# Patient Record
Sex: Female | Born: 1955 | Race: White | Hispanic: No | Smoking: Former smoker
Health system: Southern US, Community
[De-identification: ages and names within clinical notes are randomized; demographics above are authoritative.]

## PROBLEM LIST (undated history)

## (undated) DIAGNOSIS — J449 Chronic obstructive pulmonary disease, unspecified: Secondary | ICD-10-CM

## (undated) DIAGNOSIS — E079 Disorder of thyroid, unspecified: Secondary | ICD-10-CM

## (undated) DIAGNOSIS — E7800 Pure hypercholesterolemia, unspecified: Secondary | ICD-10-CM

## (undated) DIAGNOSIS — I10 Essential (primary) hypertension: Secondary | ICD-10-CM

## (undated) DIAGNOSIS — I219 Acute myocardial infarction, unspecified: Secondary | ICD-10-CM

## (undated) DIAGNOSIS — K219 Gastro-esophageal reflux disease without esophagitis: Secondary | ICD-10-CM

## (undated) DIAGNOSIS — J439 Emphysema, unspecified: Secondary | ICD-10-CM

## (undated) DIAGNOSIS — K529 Noninfective gastroenteritis and colitis, unspecified: Secondary | ICD-10-CM

## (undated) HISTORY — PX: CHOLECYSTECTOMY: SHX55

---

## 2013-01-21 ENCOUNTER — Emergency Department (HOSPITAL_COMMUNITY): Payer: MEDICARE

## 2013-01-21 ENCOUNTER — Encounter (HOSPITAL_COMMUNITY): Payer: Self-pay | Admitting: Emergency Medicine

## 2013-01-21 ENCOUNTER — Observation Stay (HOSPITAL_COMMUNITY)
Admission: EM | Admit: 2013-01-21 | Discharge: 2013-01-23 | Disposition: A | Discharge status: Home / Self Care | Payer: MEDICARE | Attending: Internal Medicine | Admitting: Internal Medicine

## 2013-01-21 DIAGNOSIS — I10 Essential (primary) hypertension: Secondary | ICD-10-CM | POA: Insufficient documentation

## 2013-01-21 DIAGNOSIS — Z6841 Body Mass Index (BMI) 40.0 and over, adult: Secondary | ICD-10-CM | POA: Insufficient documentation

## 2013-01-21 DIAGNOSIS — Z8249 Family history of ischemic heart disease and other diseases of the circulatory system: Secondary | ICD-10-CM | POA: Insufficient documentation

## 2013-01-21 DIAGNOSIS — J449 Chronic obstructive pulmonary disease, unspecified: Secondary | ICD-10-CM | POA: Diagnosis present

## 2013-01-21 DIAGNOSIS — J9610 Chronic respiratory failure, unspecified whether with hypoxia or hypercapnia: Secondary | ICD-10-CM | POA: Insufficient documentation

## 2013-01-21 DIAGNOSIS — E7800 Pure hypercholesterolemia, unspecified: Secondary | ICD-10-CM | POA: Insufficient documentation

## 2013-01-21 DIAGNOSIS — K219 Gastro-esophageal reflux disease without esophagitis: Secondary | ICD-10-CM | POA: Insufficient documentation

## 2013-01-21 DIAGNOSIS — E785 Hyperlipidemia, unspecified: Secondary | ICD-10-CM

## 2013-01-21 DIAGNOSIS — E079 Disorder of thyroid, unspecified: Secondary | ICD-10-CM | POA: Insufficient documentation

## 2013-01-21 DIAGNOSIS — Z7982 Long term (current) use of aspirin: Secondary | ICD-10-CM | POA: Insufficient documentation

## 2013-01-21 DIAGNOSIS — Z87891 Personal history of nicotine dependence: Secondary | ICD-10-CM | POA: Insufficient documentation

## 2013-01-21 DIAGNOSIS — Z9981 Dependence on supplemental oxygen: Secondary | ICD-10-CM | POA: Insufficient documentation

## 2013-01-21 DIAGNOSIS — I252 Old myocardial infarction: Secondary | ICD-10-CM | POA: Insufficient documentation

## 2013-01-21 DIAGNOSIS — J438 Other emphysema: Secondary | ICD-10-CM | POA: Insufficient documentation

## 2013-01-21 DIAGNOSIS — R0789 Other chest pain: Principal | ICD-10-CM | POA: Insufficient documentation

## 2013-01-21 DIAGNOSIS — R079 Chest pain, unspecified: Secondary | ICD-10-CM | POA: Diagnosis present

## 2013-01-21 HISTORY — DX: Acute myocardial infarction, unspecified: I21.9

## 2013-01-21 HISTORY — DX: Gastro-esophageal reflux disease without esophagitis: K21.9

## 2013-01-21 HISTORY — DX: Disorder of thyroid, unspecified: E07.9

## 2013-01-21 HISTORY — DX: Chronic obstructive pulmonary disease, unspecified: J44.9

## 2013-01-21 HISTORY — DX: Noninfective gastroenteritis and colitis, unspecified: K52.9

## 2013-01-21 HISTORY — DX: Pure hypercholesterolemia, unspecified: E78.00

## 2013-01-21 HISTORY — DX: Essential (primary) hypertension: I10

## 2013-01-21 HISTORY — DX: Emphysema, unspecified: J43.9

## 2013-01-21 LAB — COMPREHENSIVE METABOLIC PANEL
AST: 29 U/L (ref 0–37)
BUN: 14 mg/dL (ref 6–23)
CO2: 33 mEq/L — ABNORMAL HIGH (ref 19–32)
Calcium: 9.8 mg/dL (ref 8.4–10.5)
Chloride: 93 mEq/L — ABNORMAL LOW (ref 96–112)
Creatinine, Ser: 1.26 mg/dL — ABNORMAL HIGH (ref 0.50–1.10)
GFR calc non Af Amer: 47 mL/min — ABNORMAL LOW (ref >90)
Glucose, Bld: 122 mg/dL — ABNORMAL HIGH (ref 70–99)
Total Bilirubin: 0.3 mg/dL (ref 0.3–1.2)

## 2013-01-21 LAB — CBC WITH DIFFERENTIAL/PLATELET
HCT: 39.9 % (ref 36.0–46.0)
Hemoglobin: 12.8 g/dL (ref 12.0–15.0)
Lymphocytes Relative: 27 % (ref 12–46)
Lymphs Abs: 2.3 10*3/uL (ref 0.7–4.0)
Monocytes Absolute: 0.6 10*3/uL (ref 0.1–1.0)
Monocytes Relative: 7 % (ref 3–12)
Neutro Abs: 5.3 10*3/uL (ref 1.7–7.7)
RBC: 4.34 MIL/uL (ref 3.87–5.11)
WBC: 8.5 10*3/uL (ref 4.0–10.5)

## 2013-01-21 LAB — TROPONIN I: Troponin I: <0.3 ng/mL (ref <0.30)

## 2013-01-21 LAB — PRO B NATRIURETIC PEPTIDE: Pro B Natriuretic peptide (BNP): 81.4 pg/mL (ref 0–125)

## 2013-01-21 MED ORDER — IOHEXOL 350 MG/ML SOLN
100.0000 mL | Freq: Once | INTRAVENOUS | Status: AC | PRN
  Administered 2013-01-21: 100 mL via INTRAVENOUS

## 2013-01-21 MED ORDER — SODIUM CHLORIDE 0.9 % IV BOLUS (SEPSIS)
1000.0000 mL | Freq: Once | INTRAVENOUS | Status: AC
  Administered 2013-01-22: 1000 mL via INTRAVENOUS

## 2013-01-21 MED ORDER — SODIUM CHLORIDE 0.9 % IV BOLUS (SEPSIS)
1000.0000 mL | Freq: Once | INTRAVENOUS | Status: AC
  Administered 2013-01-21: 1000 mL via INTRAVENOUS

## 2013-01-21 NOTE — ED Notes (Signed)
Pt BIB EMS. Pt is here visiting family, originally from [place]. Pt states that she was watching TV when she started having left sided chest pain. Pt reports that she has a family history of sudden, fatal MIs, so she always come to the hospital if she starts having chest pain. Pt also has personal hx of MI. Pt states she took 2 NTG with no relief, and the medic reported that the pt was given 2 NTG and 324 mg of Aspirin en route with no relief.

## 2013-01-21 NOTE — ED Notes (Signed)
Pt states that she had a stress test a few months ago, and reports that her heart is "ok".

## 2013-01-21 NOTE — ED Provider Notes (Signed)
CSN: [id number]     Arrival date & time 01/21/13  1951 History   First MD Initiated Contact with Patient 01/21/13 2109     Chief Complaint  Patient presents with  . Chest Pain  . Shortness of Breath   (Consider location/radiation/quality/duration/timing/severity/associated sxs/prior Treatment) Patient is a 57 y.o. female presenting with chest pain.  Chest Pain Pain location:  L chest and substernal area Pain quality: pressure   Pain radiates to:  Does not radiate Pain severity:  Moderate Onset quality:  Sudden Timing:  Constant Progression:  Unchanged Chronicity:  New Context: at rest   Relieved by:  Nothing Ineffective treatments:  Nitroglycerin Associated symptoms: diaphoresis, dizziness, nausea and shortness of breath   Associated symptoms: no abdominal pain, no cough, no fever and not vomiting     Past Medical History  Diagnosis Date  . COPD (chronic obstructive pulmonary disease)   . Hypertension   . Emphysema   . GERD (gastroesophageal reflux disease)   . High cholesterol   . Thyroid disease   . MI (myocardial infarction)   . Colitis    Past Surgical History  Procedure Laterality Date  . Cholecystectomy    . Cesarean section     History reviewed. No pertinent family history. History  Substance Use Topics  . Smoking status: Former Games developer  . Smokeless tobacco: Not on file  . Alcohol Use: No   OB History   Grav Para Term Preterm Abortions TAB SAB Ect Mult Living                 Review of Systems  Constitutional: Positive for diaphoresis. Negative for fever.  HENT: Negative for congestion.   Respiratory: Positive for shortness of breath. Negative for cough.   Cardiovascular: Positive for chest pain.  Gastrointestinal: Positive for nausea. Negative for vomiting, abdominal pain and diarrhea.  Neurological: Positive for dizziness.  All other systems reviewed and are negative.    Allergies  Ativan; Prednisone; Atorvastatin; and Vicodin  Home  Medications   Current Outpatient Rx  Name  Route  Sig  Dispense  Refill  . albuterol (PROVENTIL HFA;VENTOLIN HFA) 108 (90 BASE) MCG/ACT inhaler   Inhalation   Inhale 1-2 puffs into the lungs every 6 (six) hours as needed for wheezing or shortness of breath.         . clonazePAM (KLONOPIN) 2 MG tablet   Oral   Take 2 mg by mouth 2 (two) times daily.         . diphenhydrAMINE (BENADRYL) 25 MG tablet   Oral   Take 25 mg by mouth at bedtime as needed for sleep.         . DULoxetine (CYMBALTA) 30 MG capsule   Oral   Take 30 mg by mouth every morning.         . DULoxetine (CYMBALTA) 60 MG capsule   Oral   Take 60 mg by mouth daily.          Marland Kitchen esomeprazole (NEXIUM) 40 MG capsule   Oral   Take 40 mg by mouth daily at 12 noon.         . Fluticasone-Salmeterol (ADVAIR) 250-50 MCG/DOSE AEPB   Inhalation   Inhale 1 puff into the lungs 2 (two) times daily.         Marland Kitchen levothyroxine (SYNTHROID, LEVOTHROID) 125 MCG tablet   Oral   Take 250 mcg by mouth daily before breakfast. Takes 2 tablet every morning         .  lisinopril-hydrochlorothiazide (PRINZIDE,ZESTORETIC) 20-12.5 MG per tablet   Oral   Take 1 tablet by mouth daily.         . Multiple Vitamin (MULTIVITAMIN WITH MINERALS) TABS tablet   Oral   Take 1 tablet by mouth daily.         Marland Kitchen oxyCODONE (ROXICODONE) 15 MG immediate release tablet   Oral   Take 15 mg by mouth every 12 (twelve) hours.         Marland Kitchen tiotropium (SPIRIVA) 18 MCG inhalation capsule   Inhalation   Place 18 mcg into inhaler and inhale daily.         Marland Kitchen topiramate (TOPAMAX) 100 MG tablet   Oral   Take 100 mg by mouth 2 (two) times daily.          BP 82/68  Pulse 88  Temp(Src) 97.4 F (36.3 C) (Oral)  Resp 22  Ht 5\' 9"  (1.753 m)  Wt 334 lb (151.501 kg)  BMI 49.30 kg/m2  SpO2 95% Physical Exam  Nursing note and vitals reviewed. Constitutional: She is oriented to person, place, and time. She appears well-developed and  well-nourished. No distress.  obese  HENT:  Head: Normocephalic and atraumatic.  Mouth/Throat: Oropharynx is clear and moist.  Eyes: Conjunctivae are normal. Pupils are equal, round, and reactive to light. No scleral icterus.  Neck: Neck supple.  Cardiovascular: Normal rate, regular rhythm, normal heart sounds and intact distal pulses.   No murmur heard. Pulmonary/Chest: Effort normal and breath sounds normal. No stridor. No respiratory distress. She has no wheezes. She has no rales. She exhibits tenderness.  Abdominal: Soft. Bowel sounds are normal. She exhibits no distension. There is no tenderness. There is no rebound and no guarding.  Musculoskeletal: Normal range of motion. She exhibits edema (1+ nonpitting edema in LLE).  Neurological: She is alert and oriented to person, place, and time.  Skin: Skin is warm and dry. No rash noted.  Psychiatric: She has a normal mood and affect. Her behavior is normal.    ED Course  Procedures (including critical care time) Labs Review Labs Reviewed  COMPREHENSIVE METABOLIC PANEL - Abnormal; Notable for the following:    Chloride 93 (*)    CO2 33 (*)    Glucose, Bld 122 (*)    Creatinine, Ser 1.26 (*)    Alkaline Phosphatase 135 (*)    GFR calc non Af Amer 47 (*)    GFR calc Af Amer 54 (*)    All other components within normal limits  CBC WITH DIFFERENTIAL  TROPONIN I  PRO B NATRIURETIC PEPTIDE   Imaging Review Ct Angio Chest Pe W/cm &/or Wo Cm  01/22/2013   CLINICAL DATA:  Chest pain  EXAM: CT ANGIOGRAPHY CHEST WITH CONTRAST  TECHNIQUE: Multidetector CT imaging of the chest was performed using the standard protocol during bolus administration of intravenous contrast. Multiplanar CT image reconstructions including MIPs were obtained to evaluate the vascular anatomy.  CONTRAST:  OMNIPAQUE IOHEXOL 350 MG/ML SOLN  COMPARISON:  none  FINDINGS: Previous filling defects within the pulmonary arteries to suggest acute pulmonary embolism. No  acute findings aorta great vessels. No pericardial fluid.  No axillary or supraclavicular lymphadenopathy. The thyroid gland is enlarged. The left lobe extends beneath the manubrium.  Review of the lung parenchyma demonstrates no effusion, infiltrate, pneumothorax. Review of the upper abdomen is unremarkable. Limited view of the skeleton is unremarkable.  Review of the MIP images confirms the above findings.  IMPRESSION: No evidence of  acute pulmonary embolism.  Thyroid goiter   Electronically Signed   By: Genevive Bi M.D.   On: 01/22/2013 00:01   Dg Chest Port 1 View  01/21/2013   CLINICAL DATA:  Chest pain.  EXAM: PORTABLE CHEST - 1 VIEW  COMPARISON:  None.  FINDINGS: The lungs are well-aerated. Minimal right basilar airspace opacity likely reflects atelectasis. There is no evidence of pleural effusion or pneumothorax.  The cardiomediastinal silhouette is within normal limits. No acute osseous abnormalities are seen.  IMPRESSION: Minimal right basilar airspace opacity likely reflects atelectasis; lungs otherwise clear.   Electronically Signed   By: Roanna Raider M.D.   On: 01/21/2013 22:51  All radiology studies independently viewed by me.     EKG Interpretation     Ventricular Rate:  86 PR Interval:  174 QRS Duration: 97 QT Interval:  411 QTC Calculation: 492 R Axis:   -75 Text Interpretation:  Age not entered, assumed to be  57 years old for purpose of ECG interpretation Sinus rhythm Left anterior fascicular block Low voltage, precordial leads Abnormal R-wave progression, late transition Borderline prolonged QT interval No old tracing to compare            MDM   1. Chest pain    57 yo female with hx of CAD and COPD presenting with pressure like chest pain.  Given 324mg  aspirin by EMS PTA.  On arrival, BPs soft, but no distress and well appearing.  No fevers or cough.  Recent car ride and unilateral leg swelling, will obtain CTA PE study as well.    CTA PE study negative.   Initial troponin negative.  Consulted internal medicine for admission and further workup.  Fluids improved BP somewhat.    Candyce Churn, MD 01/22/13 1106

## 2013-01-21 NOTE — ED Notes (Signed)
Wofford, MD at bedside 

## 2013-01-22 DIAGNOSIS — I10 Essential (primary) hypertension: Secondary | ICD-10-CM

## 2013-01-22 DIAGNOSIS — R079 Chest pain, unspecified: Secondary | ICD-10-CM | POA: Diagnosis present

## 2013-01-22 LAB — TROPONIN I
Troponin I: <0.3 ng/mL (ref <0.30)
Troponin I: <0.3 ng/mL (ref <0.30)

## 2013-01-22 LAB — POCT I-STAT TROPONIN I: Troponin i, poc: 0 ng/mL (ref 0.00–0.08)

## 2013-01-22 MED ORDER — ASPIRIN 325 MG PO TABS
325.0000 mg | ORAL_TABLET | Freq: Every day | ORAL | Status: DC
  Administered 2013-01-22: 325 mg via ORAL
  Filled 2013-01-22 (×2): qty 1

## 2013-01-22 MED ORDER — SODIUM CHLORIDE 0.9 % IV SOLN: 250.0000 mL | INTRAVENOUS | Status: DC | PRN

## 2013-01-22 MED ORDER — ALBUTEROL SULFATE HFA 108 (90 BASE) MCG/ACT IN AERS: 1.0000 | INHALATION_SPRAY | Freq: Four times a day (QID) | RESPIRATORY_TRACT | Status: DC | PRN

## 2013-01-22 MED ORDER — SODIUM CHLORIDE 0.9 % IJ SOLN
3.0000 mL | Freq: Two times a day (BID) | INTRAMUSCULAR | Status: DC
  Administered 2013-01-22: 3 mL via INTRAVENOUS

## 2013-01-22 MED ORDER — SODIUM CHLORIDE 0.9 % IJ SOLN: 3.0000 mL | INTRAMUSCULAR | Status: DC | PRN

## 2013-01-22 MED ORDER — LEVOTHYROXINE SODIUM 125 MCG PO TABS
250.0000 ug | ORAL_TABLET | Freq: Every day | ORAL | Status: DC
  Administered 2013-01-22 – 2013-01-23 (×2): 250 ug via ORAL
  Filled 2013-01-22 (×3): qty 2

## 2013-01-22 MED ORDER — DULOXETINE HCL 60 MG PO CPEP: 60.0000 mg | ORAL_CAPSULE | Freq: Every day | ORAL | Status: DC

## 2013-01-22 MED ORDER — MOMETASONE FURO-FORMOTEROL FUM 100-5 MCG/ACT IN AERO
2.0000 | INHALATION_SPRAY | Freq: Two times a day (BID) | RESPIRATORY_TRACT | Status: DC
  Administered 2013-01-22 – 2013-01-23 (×3): 2 via RESPIRATORY_TRACT
  Filled 2013-01-22: qty 8.8

## 2013-01-22 MED ORDER — OXYCODONE HCL 5 MG PO TABS
15.0000 mg | ORAL_TABLET | Freq: Two times a day (BID) | ORAL | Status: DC
  Administered 2013-01-22 – 2013-01-23 (×4): 15 mg via ORAL
  Filled 2013-01-22 (×4): qty 3

## 2013-01-22 MED ORDER — ASPIRIN 81 MG PO CHEW
81.0000 mg | CHEWABLE_TABLET | ORAL | Status: AC
Start: 2013-01-23 — End: 2013-01-23
  Administered 2013-01-23: 81 mg via ORAL
  Filled 2013-01-22: qty 1

## 2013-01-22 MED ORDER — LISINOPRIL-HYDROCHLOROTHIAZIDE 20-12.5 MG PO TABS: 1.0000 | ORAL_TABLET | Freq: Every day | ORAL | Status: DC

## 2013-01-22 MED ORDER — NITROGLYCERIN 2 % TD OINT
0.5000 [in_us] | TOPICAL_OINTMENT | Freq: Four times a day (QID) | TRANSDERMAL | Status: DC
  Administered 2013-01-22 – 2013-01-23 (×2): 0.5 [in_us] via TOPICAL
  Filled 2013-01-22: qty 30

## 2013-01-22 MED ORDER — SODIUM CHLORIDE 0.9 % IV SOLN
INTRAVENOUS | Status: DC
  Administered 2013-01-23: 05:00:00 via INTRAVENOUS

## 2013-01-22 MED ORDER — TOPIRAMATE 100 MG PO TABS
100.0000 mg | ORAL_TABLET | Freq: Two times a day (BID) | ORAL | Status: DC
  Administered 2013-01-22 (×3): 100 mg via ORAL
  Filled 2013-01-22 (×5): qty 1

## 2013-01-22 MED ORDER — PANTOPRAZOLE SODIUM 40 MG PO TBEC
80.0000 mg | DELAYED_RELEASE_TABLET | Freq: Every day | ORAL | Status: DC
  Administered 2013-01-22 – 2013-01-23 (×2): 80 mg via ORAL
  Filled 2013-01-22 (×2): qty 2

## 2013-01-22 MED ORDER — HEPARIN SODIUM (PORCINE) 5000 UNIT/ML IJ SOLN
5000.0000 [IU] | Freq: Three times a day (TID) | INTRAMUSCULAR | Status: DC
  Administered 2013-01-22 – 2013-01-23 (×3): 5000 [IU] via SUBCUTANEOUS
  Filled 2013-01-22 (×8): qty 1

## 2013-01-22 MED ORDER — DIPHENHYDRAMINE HCL 25 MG PO TABS
25.0000 mg | ORAL_TABLET | Freq: Every evening | ORAL | Status: DC | PRN
  Filled 2013-01-22: qty 1

## 2013-01-22 MED ORDER — LISINOPRIL 20 MG PO TABS
20.0000 mg | ORAL_TABLET | Freq: Every day | ORAL | Status: DC
  Administered 2013-01-22 – 2013-01-23 (×2): 20 mg via ORAL
  Filled 2013-01-22 (×2): qty 1

## 2013-01-22 MED ORDER — ACETAMINOPHEN 325 MG PO TABS: 650.0000 mg | ORAL_TABLET | ORAL | Status: DC | PRN

## 2013-01-22 MED ORDER — ADULT MULTIVITAMIN W/MINERALS CH
1.0000 | ORAL_TABLET | Freq: Every day | ORAL | Status: DC
  Administered 2013-01-22 – 2013-01-23 (×2): 1 via ORAL
  Filled 2013-01-22 (×2): qty 1

## 2013-01-22 MED ORDER — DULOXETINE HCL 60 MG PO CPEP
90.0000 mg | ORAL_CAPSULE | Freq: Every day | ORAL | Status: DC
  Administered 2013-01-22 – 2013-01-23 (×2): 90 mg via ORAL
  Filled 2013-01-22 (×2): qty 1

## 2013-01-22 MED ORDER — DULOXETINE HCL 30 MG PO CPEP: 30.0000 mg | ORAL_CAPSULE | Freq: Every morning | ORAL | Status: DC

## 2013-01-22 MED ORDER — ONDANSETRON HCL 4 MG/2ML IJ SOLN: 4.0000 mg | Freq: Four times a day (QID) | INTRAMUSCULAR | Status: DC | PRN

## 2013-01-22 MED ORDER — FENTANYL CITRATE 0.05 MG/ML IJ SOLN
50.0000 ug | Freq: Once | INTRAMUSCULAR | Status: AC
  Administered 2013-01-22: 50 ug via INTRAVENOUS
  Filled 2013-01-22: qty 2

## 2013-01-22 MED ORDER — HYDROCHLOROTHIAZIDE 12.5 MG PO CAPS
12.5000 mg | ORAL_CAPSULE | Freq: Every day | ORAL | Status: DC
  Administered 2013-01-22 – 2013-01-23 (×2): 12.5 mg via ORAL
  Filled 2013-01-22 (×2): qty 1

## 2013-01-22 MED ORDER — CLONAZEPAM 1 MG PO TABS
2.0000 mg | ORAL_TABLET | Freq: Two times a day (BID) | ORAL | Status: DC
  Administered 2013-01-22 (×2): 2 mg via ORAL
  Filled 2013-01-22 (×2): qty 2

## 2013-01-22 MED ORDER — TIOTROPIUM BROMIDE MONOHYDRATE 18 MCG IN CAPS
18.0000 ug | ORAL_CAPSULE | Freq: Every day | RESPIRATORY_TRACT | Status: DC
  Administered 2013-01-22 – 2013-01-23 (×2): 18 ug via RESPIRATORY_TRACT
  Filled 2013-01-22: qty 5

## 2013-01-22 MED ORDER — DIAZEPAM 5 MG PO TABS
5.0000 mg | ORAL_TABLET | ORAL | Status: AC
  Administered 2013-01-23: 5 mg via ORAL
  Filled 2013-01-22: qty 1

## 2013-01-22 NOTE — ED Notes (Signed)
Gardener, MD at bedside.  

## 2013-01-22 NOTE — Consult Note (Signed)
Cardiology Consultation Note  Patient ID: Mary Ryan, MRN: [id number], DOB/AGE: [id number] 56 y.o. Admit date: 01/21/2013   Date of Consult: 01/22/2013 Primary Physician: Pcp Not In System Primary Cardiologist:  Chief Complaint: chest pain  Reason for Consult: chest pain    Assessment and Plan:  Precordial Chest pain - atypical , however pt with risk factors and hx of prior MI  HTN  HLD   Plan  Cont aspirin , \stress test in the am  Check lipids, reports allergy to statin   HPI: 57 y.o. female with hx of HTN , HLD , FH of CAD ( father with MI at 89 ) possible prior MI p/w precordial chest pain   Pt states that she has been chest discomfort for month which now got severe enough that she came into the ER. Describes this as "500 lbs sitting on my chest" , constant in nature, not related with exertion or improvement with rest . No associated SOB , or nausea.   Pt denies any orthopnea, PND, LE edema , syncope, bleeding , focal weakness or claudication  Reports compliance with medication  apparently had stress test 2 months ago in [place] that was normal    Past Medical History  Diagnosis Date  . COPD (chronic obstructive pulmonary disease)   . Hypertension   . Emphysema   . GERD (gastroesophageal reflux disease)   . High cholesterol   . Thyroid disease   . MI (myocardial infarction)   . Colitis        Surgical History:  Past Surgical History  Procedure Laterality Date  . Cholecystectomy    . Cesarean section       Home Meds: Prior to Admission medications   Medication Sig Start Date End Date Taking? Authorizing Provider  albuterol (PROVENTIL HFA;VENTOLIN HFA) 108 (90 BASE) MCG/ACT inhaler Inhale 1-2 puffs into the lungs every 6 (six) hours as needed for wheezing or shortness of breath.   Yes Historical Provider, MD  clonazePAM (KLONOPIN) 2 MG tablet Take 2 mg by mouth 2 (two) times daily.   Yes Historical Provider, MD  diphenhydrAMINE (BENADRYL) 25 MG tablet Take 25 mg  by mouth at bedtime as needed for sleep.   Yes Historical Provider, MD  DULoxetine (CYMBALTA) 30 MG capsule Take 30 mg by mouth every morning.   Yes Historical Provider, MD  DULoxetine (CYMBALTA) 60 MG capsule Take 60 mg by mouth daily.    Yes Historical Provider, MD  esomeprazole (NEXIUM) 40 MG capsule Take 40 mg by mouth daily at 12 noon.   Yes Historical Provider, MD  Fluticasone-Salmeterol (ADVAIR) 250-50 MCG/DOSE AEPB Inhale 1 puff into the lungs 2 (two) times daily.   Yes Historical Provider, MD  levothyroxine (SYNTHROID, LEVOTHROID) 125 MCG tablet Take 250 mcg by mouth daily before breakfast. Takes 2 tablet every morning   Yes Historical Provider, MD  lisinopril-hydrochlorothiazide (PRINZIDE,ZESTORETIC) 20-12.5 MG per tablet Take 1 tablet by mouth daily.   Yes Historical Provider, MD  Multiple Vitamin (MULTIVITAMIN WITH MINERALS) TABS tablet Take 1 tablet by mouth daily.   Yes Historical Provider, MD  oxyCODONE (ROXICODONE) 15 MG immediate release tablet Take 15 mg by mouth every 12 (twelve) hours.   Yes Historical Provider, MD  tiotropium (SPIRIVA) 18 MCG inhalation capsule Place 18 mcg into inhaler and inhale daily.   Yes Historical Provider, MD  topiramate (TOPAMAX) 100 MG tablet Take 100 mg by mouth 2 (two) times daily.   Yes Historical Provider, MD    Inpatient Medications:  .  clonazePAM  2 mg Oral BID  . DULoxetine  90 mg Oral Daily  . heparin  5,000 Units Subcutaneous Q8H  . hydrochlorothiazide  12.5 mg Oral Daily  . levothyroxine  250 mcg Oral QAC breakfast  . lisinopril  20 mg Oral Daily  . mometasone-formoterol  2 puff Inhalation BID  . multivitamin with minerals  1 tablet Oral Daily  . oxyCODONE  15 mg Oral Q12H  . pantoprazole  80 mg Oral Q1200  . tiotropium  18 mcg Inhalation Daily  . topiramate  100 mg Oral BID      Allergies:  Allergies  Allergen Reactions  . Ativan [Lorazepam] Other (See Comments)    Pt states that Ativan causes her to have suicidal thoughts.     . Prednisone Other (See Comments)    Anger, violent behaviors, aggression  . Atorvastatin Nausea And Vomiting  . Vicodin [Hydrocodone-Acetaminophen] Nausea And Vomiting    History   Social History  . Marital Status: Unknown    Spouse Name: N/A    Number of Children: N/A  . Years of Education: N/A   Occupational History  . Not on file.   Social History Main Topics  . Smoking status: Former Games developer  . Smokeless tobacco: Not on file  . Alcohol Use: No  . Drug Use: No  . Sexual Activity: Not on file   Other Topics Concern  . Not on file   Social History Narrative  . No narrative on file     History reviewed. No pertinent family history.    Labs:  Recent Labs  01/21/13 2200  TROPONINI <0.30   Lab Results  Component Value Date   WBC 8.5 01/21/2013   HGB 12.8 01/21/2013   HCT 39.9 01/21/2013   MCV 91.9 01/21/2013   PLT 347 01/21/2013    Recent Labs Lab 01/21/13 2159  NA 135  K 4.0  CL 93*  CO2 33*  BUN 14  CREATININE 1.26*  CALCIUM 9.8  PROT 7.9  BILITOT 0.3  ALKPHOS 135*  ALT 28  AST 29  GLUCOSE 122*    Radiology/Studies:  Ct Angio Chest Pe W/cm &/or Wo Cm  01/22/2013   CLINICAL DATA:  Chest pain  EXAM: CT ANGIOGRAPHY CHEST WITH CONTRAST  TECHNIQUE: Multidetector CT imaging of the chest was performed using the standard protocol during bolus administration of intravenous contrast. Multiplanar CT image reconstructions including MIPs were obtained to evaluate the vascular anatomy.  CONTRAST:  OMNIPAQUE IOHEXOL 350 MG/ML SOLN  COMPARISON:  none  FINDINGS: Previous filling defects within the pulmonary arteries to suggest acute pulmonary embolism. No acute findings aorta great vessels. No pericardial fluid.  No axillary or supraclavicular lymphadenopathy. The thyroid gland is enlarged. The left lobe extends beneath the manubrium.  Review of the lung parenchyma demonstrates no effusion, infiltrate, pneumothorax. Review of the upper abdomen is  unremarkable. Limited view of the skeleton is unremarkable.  Review of the MIP images confirms the above findings.  IMPRESSION: No evidence of acute pulmonary embolism.  Thyroid goiter   Electronically Signed   By: Genevive Bi M.D.   On: 01/22/2013 00:01   Dg Chest Port 1 View  01/21/2013   CLINICAL DATA:  Chest pain.  EXAM: PORTABLE CHEST - 1 VIEW  COMPARISON:  None.  FINDINGS: The lungs are well-aerated. Minimal right basilar airspace opacity likely reflects atelectasis. There is no evidence of pleural effusion or pneumothorax.  The cardiomediastinal silhouette is within normal limits. No acute osseous abnormalities are  seen.  IMPRESSION: Minimal right basilar airspace opacity likely reflects atelectasis; lungs otherwise clear.   Electronically Signed   By: Roanna Raider M.D.   On: 01/21/2013 22:51    EKG: 01/20/2013 NSR, LAFB , prolonged Qtc 490  Physical Exam: Blood pressure 102/62, pulse 76, temperature 97.7 F (36.5 C), temperature source Oral, resp. rate 18, height 5\' 10"  (1.778 m), weight 148.871 kg (328 lb 3.2 oz), SpO2 99.00%. General: obese Well developed, well nourished, in no acute distress. Neck: Negative for carotid bruits. JVD not elevated. Lungs: Clear bilaterally to auscultation without wheezes, rales, or rhonchi. Breathing is unlabored. Heart: RRR with S1 S2. No murmurs, rubs, or gallops appreciated. Abdomen: Soft, non-tender, non-distended with normoactive bowel sounds. No hepatomegaly. No rebound/guarding. No obvious abdominal masses. Msk:  Strength and tone appear normal for age. Extremities: No clubbing or cyanosis. No edema.  Distal pedal pulses are 2+ and equal bilaterally. Psych:  Responds to questions appropriately with a normal affect.       Signed, Don Broach, [place], A PA-C 01/22/2013, 5:17 AM

## 2013-01-22 NOTE — Progress Notes (Addendum)
[facility] PROGRESS NOTE    Mary Ryan ZOX:[id number] DOB: [birth year] DOA: 01/21/2013 PCP: Pcp Not In System  HPI/Brief narrative 57 year old female with history of morbid obesity, COPD, chronic respiratory failure on continuous home O2 4 L per minute, hypertension, hyperlipidemia, prior MI, strong family history of CAD, cardiac cath couple of years ago said to be negative and recent stress test 1-2 months ago said to be negative, recently traveled from [place], admitted with chest pain. Patient states that she normally has chest pain at least once a week that is relieved by NTG but last night she had chest pain at rest in the precordial region which did not resolve despite NTG x4. Patient admitted for further evaluation. Unable to do a stress test secondary to large size. Cardiology has consulted and planned cardiac cath 11/17.  Assessment/Plan:  Chest pain/Unstable angina - Multiple risk factors for CAD: Morbid obesity, hyperlipidemia, hypertension and strong family history of CAD - Cardiac enzymes negative and EKG without acute changes (low-voltage, sinus rhythm, LAFB). CTA negative for acute PE - Cardiology consultation appreciated-plan for cardiac cath 11/17 - Continue aspirin and add NTP  COPD/O2 dependent chronic respiratory failure - Stable. - Continue oxygen.  Hypertension - Controlled  Hyperlipidemia   Morbid obesity     DVT prophylaxis: Heparin Code Status: Full Family Communication: None Disposition Plan: Home when medically stable   Consultants:  Cardiology  Procedures:  None  Antibiotics:  None   Subjective: Mild intermittent 3/10 precordial chest pain.  Objective: Filed Vitals:   01/22/13 0130 01/22/13 0211 01/22/13 0459 01/22/13 0857  BP: 116/77 112/72 102/62   Pulse: 78 72 76   Temp:  97.9 F (36.6 C) 97.7 F (36.5 C)   TempSrc:      Resp: 13 18 18    Height:  5\' 10"  (1.778 m)    Weight:  148.871 kg (328 lb 3.2 oz)    SpO2: 98% 100%  99% 99%    Intake/Output Summary (Last 24 hours) at 01/22/13 1216 Last data filed at 01/22/13 0135  Gross per 24 hour  Intake   1000 ml  Output      0 ml  Net   1000 ml   Filed Weights   01/21/13 2015 01/22/13 0211  Weight: 151.501 kg (334 lb) 148.871 kg (328 lb 3.2 oz)     Exam:  General exam: Morbidly obese female sitting up on bed without distress. Respiratory system: Clear. No increased work of breathing. Cardiovascular system: S1 & S2 heard, RRR. No JVD, murmurs, gallops, clicks or pedal edema. Telemetry: Sinus rhythm. Gastrointestinal system: Abdomen is nondistended, soft and nontender. Normal bowel sounds heard. Central nervous system: Alert and oriented. No focal neurological deficits. Extremities: Symmetric 5 x 5 power.   Data Reviewed: Basic Metabolic Panel:  Recent Labs Lab 01/21/13 2159  NA 135  K 4.0  CL 93*  CO2 33*  GLUCOSE 122*  BUN 14  CREATININE 1.26*  CALCIUM 9.8   Liver Function Tests:  Recent Labs Lab 01/21/13 2159  AST 29  ALT 28  ALKPHOS 135*  BILITOT 0.3  PROT 7.9  ALBUMIN 3.6   No results found for this basename: LIPASE, AMYLASE,  in the last 168 hours No results found for this basename: AMMONIA,  in the last 168 hours CBC:  Recent Labs Lab 01/21/13 2159  WBC 8.5  NEUTROABS 5.3  HGB 12.8  HCT 39.9  MCV 91.9  PLT 347   Cardiac Enzymes:  Recent Labs Lab 01/21/13 2200 01/22/13  1610  TROPONINI <0.30 <0.30   BNP (last 3 results)  Recent Labs  01/21/13 2200  PROBNP 81.4   CBG: No results found for this basename: GLUCAP,  in the last 168 hours  No results found for this or any previous visit (from the past 240 hour(s)).    Additional labs: 1. None     Studies: Ct Angio Chest Pe W/cm &/or Wo Cm  01/22/2013   CLINICAL DATA:  Chest pain  EXAM: CT ANGIOGRAPHY CHEST WITH CONTRAST  TECHNIQUE: Multidetector CT imaging of the chest was performed using the standard protocol during bolus administration of  intravenous contrast. Multiplanar CT image reconstructions including MIPs were obtained to evaluate the vascular anatomy.  CONTRAST:  OMNIPAQUE IOHEXOL 350 MG/ML SOLN  COMPARISON:  none  FINDINGS: Previous filling defects within the pulmonary arteries to suggest acute pulmonary embolism. No acute findings aorta great vessels. No pericardial fluid.  No axillary or supraclavicular lymphadenopathy. The thyroid gland is enlarged. The left lobe extends beneath the manubrium.  Review of the lung parenchyma demonstrates no effusion, infiltrate, pneumothorax. Review of the upper abdomen is unremarkable. Limited view of the skeleton is unremarkable.  Review of the MIP images confirms the above findings.  IMPRESSION: No evidence of acute pulmonary embolism.  Thyroid goiter   Electronically Signed   By: Genevive Bi M.D.   On: 01/22/2013 00:01   Dg Chest Port 1 View  01/21/2013   CLINICAL DATA:  Chest pain.  EXAM: PORTABLE CHEST - 1 VIEW  COMPARISON:  None.  FINDINGS: The lungs are well-aerated. Minimal right basilar airspace opacity likely reflects atelectasis. There is no evidence of pleural effusion or pneumothorax.  The cardiomediastinal silhouette is within normal limits. No acute osseous abnormalities are seen.  IMPRESSION: Minimal right basilar airspace opacity likely reflects atelectasis; lungs otherwise clear.   Electronically Signed   By: Roanna Raider M.D.   On: 01/21/2013 22:51        Scheduled Meds: . aspirin  325 mg Oral Daily  . clonazePAM  2 mg Oral BID  . DULoxetine  90 mg Oral Daily  . heparin  5,000 Units Subcutaneous Q8H  . hydrochlorothiazide  12.5 mg Oral Daily  . levothyroxine  250 mcg Oral QAC breakfast  . lisinopril  20 mg Oral Daily  . mometasone-formoterol  2 puff Inhalation BID  . multivitamin with minerals  1 tablet Oral Daily  . nitroGLYCERIN  0.5 inch Topical Q6H  . oxyCODONE  15 mg Oral Q12H  . pantoprazole  80 mg Oral Q1200  . tiotropium  18 mcg Inhalation  Daily  . topiramate  100 mg Oral BID   Continuous Infusions: . [START ON 01/23/2013] sodium chloride      Principal Problem:   Chest pain Active Problems:   HTN (hypertension)    Time spent: 35 minutes    Aristotelis Vilardi, MD, FACP, FHM. [facility] Pager [phone]  If 7PM-7AM, please contact night-coverage [email or web address] Password [email or web address] 01/22/2013, 12:16 PM    LOS: 1 day

## 2013-01-22 NOTE — H&P (Signed)
[facility] History and Physical  Mary Ryan ZOX:[id number] DOB: 05-Jan-1956 DOA: 01/21/2013  Referring physician: ED PCP: Pcp Not In System  Chief Complaint: Chest pain  HPI: Mary Ryan is a 57 y.o. female who presents to the ED with chest pain.  Symptoms onset acutely this evening while watching TV.  Pain is L sided, pressure like sensation.  Has never had symptoms quite like this before.  Does have a h/o negative stress test 1-2 months ago in [place] she states.  Review of Systems: 12 systems reviewed and otherwise negative.  Past Medical History  Diagnosis Date  . COPD (chronic obstructive pulmonary disease)   . Hypertension   . Emphysema   . GERD (gastroesophageal reflux disease)   . High cholesterol   . Thyroid disease   . MI (myocardial infarction)   . Colitis    Past Surgical History  Procedure Laterality Date  . Cholecystectomy    . Cesarean section     Social History:  reports that she has quit smoking. She does not have any smokeless tobacco history on file. She reports that she does not drink alcohol or use illicit drugs.   Allergies  Allergen Reactions  . Ativan [Lorazepam] Other (See Comments)    Pt states that Ativan causes her to have suicidal thoughts.   . Prednisone Other (See Comments)    Anger, violent behaviors, aggression  . Atorvastatin Nausea And Vomiting  . Vicodin [Hydrocodone-Acetaminophen] Nausea And Vomiting    History reviewed. No pertinent family history.   Prior to Admission medications   Medication Sig Start Date End Date Taking? Authorizing Provider  albuterol (PROVENTIL HFA;VENTOLIN HFA) 108 (90 BASE) MCG/ACT inhaler Inhale 1-2 puffs into the lungs every 6 (six) hours as needed for wheezing or shortness of breath.   Yes Historical Provider, MD  clonazePAM (KLONOPIN) 2 MG tablet Take 2 mg by mouth 2 (two) times daily.   Yes Historical Provider, MD  diphenhydrAMINE (BENADRYL) 25 MG tablet Take 25 mg by mouth at bedtime as needed for  sleep.   Yes Historical Provider, MD  DULoxetine (CYMBALTA) 30 MG capsule Take 30 mg by mouth every morning.   Yes Historical Provider, MD  DULoxetine (CYMBALTA) 60 MG capsule Take 60 mg by mouth daily.    Yes Historical Provider, MD  esomeprazole (NEXIUM) 40 MG capsule Take 40 mg by mouth daily at 12 noon.   Yes Historical Provider, MD  Fluticasone-Salmeterol (ADVAIR) 250-50 MCG/DOSE AEPB Inhale 1 puff into the lungs 2 (two) times daily.   Yes Historical Provider, MD  levothyroxine (SYNTHROID, LEVOTHROID) 125 MCG tablet Take 250 mcg by mouth daily before breakfast. Takes 2 tablet every morning   Yes Historical Provider, MD  lisinopril-hydrochlorothiazide (PRINZIDE,ZESTORETIC) 20-12.5 MG per tablet Take 1 tablet by mouth daily.   Yes Historical Provider, MD  Multiple Vitamin (MULTIVITAMIN WITH MINERALS) TABS tablet Take 1 tablet by mouth daily.   Yes Historical Provider, MD  oxyCODONE (ROXICODONE) 15 MG immediate release tablet Take 15 mg by mouth every 12 (twelve) hours.   Yes Historical Provider, MD  tiotropium (SPIRIVA) 18 MCG inhalation capsule Place 18 mcg into inhaler and inhale daily.   Yes Historical Provider, MD  topiramate (TOPAMAX) 100 MG tablet Take 100 mg by mouth 2 (two) times daily.   Yes Historical Provider, MD   Physical Exam: Filed Vitals:   01/22/13 0015  BP: 100/69  Pulse: 72  Temp:   Resp: 19    General:  NAD, resting comfortably in bed Eyes:  PEERLA EOMI ENT: mucous membranes moist Neck: supple w/o JVD Cardiovascular: RRR w/o MRG Respiratory: CTA B Abdomen: soft, nt, nd, bs+ Skin: no rash nor lesion Musculoskeletal: MAE, full ROM all 4 extremities Psychiatric: normal tone and affect Neurologic: AAOx3, grossly non-focal  Labs on Admission:  Basic Metabolic Panel:  Recent Labs Lab 01/21/13 2159  NA 135  K 4.0  CL 93*  CO2 33*  GLUCOSE 122*  BUN 14  CREATININE 1.26*  CALCIUM 9.8   Liver Function Tests:  Recent Labs Lab 01/21/13 2159  AST 29  ALT  28  ALKPHOS 135*  BILITOT 0.3  PROT 7.9  ALBUMIN 3.6   No results found for this basename: LIPASE, AMYLASE,  in the last 168 hours No results found for this basename: AMMONIA,  in the last 168 hours CBC:  Recent Labs Lab 01/21/13 2159  WBC 8.5  NEUTROABS 5.3  HGB 12.8  HCT 39.9  MCV 91.9  PLT 347   Cardiac Enzymes:  Recent Labs Lab 01/21/13 2200  TROPONINI <0.30    BNP (last 3 results)  Recent Labs  01/21/13 2200  PROBNP 81.4   CBG: No results found for this basename: GLUCAP,  in the last 168 hours  Radiological Exams on Admission: Ct Angio Chest Pe W/cm &/or Wo Cm  01/22/2013   CLINICAL DATA:  Chest pain  EXAM: CT ANGIOGRAPHY CHEST WITH CONTRAST  TECHNIQUE: Multidetector CT imaging of the chest was performed using the standard protocol during bolus administration of intravenous contrast. Multiplanar CT image reconstructions including MIPs were obtained to evaluate the vascular anatomy.  CONTRAST:  OMNIPAQUE IOHEXOL 350 MG/ML SOLN  COMPARISON:  none  FINDINGS: Previous filling defects within the pulmonary arteries to suggest acute pulmonary embolism. No acute findings aorta great vessels. No pericardial fluid.  No axillary or supraclavicular lymphadenopathy. The thyroid gland is enlarged. The left lobe extends beneath the manubrium.  Review of the lung parenchyma demonstrates no effusion, infiltrate, pneumothorax. Review of the upper abdomen is unremarkable. Limited view of the skeleton is unremarkable.  Review of the MIP images confirms the above findings.  IMPRESSION: No evidence of acute pulmonary embolism.  Thyroid goiter   Electronically Signed   By: Genevive Bi M.D.   On: 01/22/2013 00:01   Dg Chest Port 1 View  01/21/2013   CLINICAL DATA:  Chest pain.  EXAM: PORTABLE CHEST - 1 VIEW  COMPARISON:  None.  FINDINGS: The lungs are well-aerated. Minimal right basilar airspace opacity likely reflects atelectasis. There is no evidence of pleural effusion or  pneumothorax.  The cardiomediastinal silhouette is within normal limits. No acute osseous abnormalities are seen.  IMPRESSION: Minimal right basilar airspace opacity likely reflects atelectasis; lungs otherwise clear.   Electronically Signed   By: Roanna Raider M.D.   On: 01/21/2013 22:51    EKG: Independently reviewed.  Assessment/Plan Principal Problem:   Chest pain   1. Chest pain - CTA negative for PE, patient does have a Heart score of 5 or 6 (no prior EKG to compare with so unclear if her LAFB is new or old), but patient also has negative stress test 1-2 months ago per her report.  Cards consulted to evaluate patient, until then admitting under chest pain obs protocol with serial troponins.    Code Status: Full Code (must indicate code status--if unknown or must be presumed, indicate so) Family Communication: No family in room (indicate person spoken with, if applicable, with phone number if by telephone) Disposition Plan: Admit  to inpatient (indicate anticipated LOS)  Time spent: 70 min  GARDNER, JARED M. [facility] Pager [phone]  If 7PM-7AM, please contact night-coverage [email or web address] Password [facility] 01/22/2013, 12:49 AM

## 2013-01-22 NOTE — Progress Notes (Signed)
Utilization Review completed.  

## 2013-01-22 NOTE — Progress Notes (Signed)
Subjective:  Mild chest tightness this am. Not in any distress. Patient morbidly obese and too large to do myoview.  Objective:  Vital Signs in the last 24 hours: Temp:  [97.4 F (36.3 C)-97.9 F (36.6 C)] 97.7 F (36.5 C) (11/16 0459) Pulse Rate:  [72-88] 76 (11/16 0459) Resp:  [13-22] 18 (11/16 0459) BP: (82-116)/(55-77) 102/62 mmHg (11/16 0459) SpO2:  [92 %-100 %] 99 % (11/16 0459) Weight:  [328 lb 3.2 oz (148.871 kg)-334 lb (151.501 kg)] 328 lb 3.2 oz (148.871 kg) (11/16 0211)  Intake/Output from previous day: 11/15 0701 - 11/16 0700 In: 1000 [I.V.:1000] Out: -  Intake/Output from this shift:    . aspirin  325 mg Oral Daily  . clonazePAM  2 mg Oral BID  . DULoxetine  90 mg Oral Daily  . heparin  5,000 Units Subcutaneous Q8H  . hydrochlorothiazide  12.5 mg Oral Daily  . levothyroxine  250 mcg Oral QAC breakfast  . lisinopril  20 mg Oral Daily  . mometasone-formoterol  2 puff Inhalation BID  . multivitamin with minerals  1 tablet Oral Daily  . nitroGLYCERIN  0.5 inch Topical Q6H  . oxyCODONE  15 mg Oral Q12H  . pantoprazole  80 mg Oral Q1200  . tiotropium  18 mcg Inhalation Daily  . topiramate  100 mg Oral BID   . [START ON 01/23/2013] sodium chloride      Physical Exam: The patient appears to be in no distress.  Head and neck exam reveals that the pupils are equal and reactive.  The extraocular movements are full.  There is no scleral icterus.  Mouth and pharynx are benign.  No lymphadenopathy.  No carotid bruits.  The jugular venous pressure is normal.  Thyroid is not enlarged or tender.  Chest is clear to percussion and auscultation.  No rales or rhonchi.  Expansion of the chest is symmetrical.  Heart reveals no abnormal lift or heave.  First and second heart sounds are normal.  There is no murmur gallop rub or click.  The abdomen is soft and nontender.  Bowel sounds are normoactive.  There is no hepatosplenomegaly or mass.  There are no abdominal  bruits.  Extremities reveal no phlebitis or edema.  Pedal pulses are good.  There is no cyanosis or clubbing.  Neurologic exam is normal strength and no lateralizing weakness.  No sensory deficits.  Integument reveals no rash  Lab Results:  Recent Labs  01/21/13 2159  WBC 8.5  HGB 12.8  PLT 347    Recent Labs  01/21/13 2159  NA 135  K 4.0  CL 93*  CO2 33*  GLUCOSE 122*  BUN 14  CREATININE 1.26*    Recent Labs  01/21/13 2200 01/22/13 1610  TROPONINI <0.30 <0.30   Hepatic Function Panel  Recent Labs  01/21/13 2159  PROT 7.9  ALBUMIN 3.6  AST 29  ALT 28  ALKPHOS 135*  BILITOT 0.3   No results found for this basename: CHOL,  in the last 72 hours No results found for this basename: PROTIME,  in the last 72 hours  Imaging: Ct Angio Chest Pe W/cm &/or Wo Cm  01/22/2013   CLINICAL DATA:  Chest pain  EXAM: CT ANGIOGRAPHY CHEST WITH CONTRAST  TECHNIQUE: Multidetector CT imaging of the chest was performed using the standard protocol during bolus administration of intravenous contrast. Multiplanar CT image reconstructions including MIPs were obtained to evaluate the vascular anatomy.  CONTRAST:  OMNIPAQUE IOHEXOL 350  MG/ML SOLN  COMPARISON:  none  FINDINGS: Previous filling defects within the pulmonary arteries to suggest acute pulmonary embolism. No acute findings aorta great vessels. No pericardial fluid.  No axillary or supraclavicular lymphadenopathy. The thyroid gland is enlarged. The left lobe extends beneath the manubrium.  Review of the lung parenchyma demonstrates no effusion, infiltrate, pneumothorax. Review of the upper abdomen is unremarkable. Limited view of the skeleton is unremarkable.  Review of the MIP images confirms the above findings.  IMPRESSION: No evidence of acute pulmonary embolism.  Thyroid goiter   Electronically Signed   By: Genevive Bi M.D.   On: 01/22/2013 00:01   Dg Chest Port 1 View  01/21/2013   CLINICAL DATA:  Chest pain.   EXAM: PORTABLE CHEST - 1 VIEW  COMPARISON:  None.  FINDINGS: The lungs are well-aerated. Minimal right basilar airspace opacity likely reflects atelectasis. There is no evidence of pleural effusion or pneumothorax.  The cardiomediastinal silhouette is within normal limits. No acute osseous abnormalities are seen.  IMPRESSION: Minimal right basilar airspace opacity likely reflects atelectasis; lungs otherwise clear.   Electronically Signed   By: Roanna Raider M.D.   On: 01/21/2013 22:51    Cardiac Studies: Telemetry shows NSR Assessment/Plan:  1. Atypical chest pain but responsive to SL NTG. Former smoker for forty years. Strong family history of CAD of multiple siblings in their 13s.  2. Hypertension. 3. Morbid obesity. 4. HLD  Plan: Will plan left heart cath Monday.  LOS: 1 day    Cassell Clement 01/22/2013, 8:32 AM

## 2013-01-23 ENCOUNTER — Encounter (HOSPITAL_COMMUNITY)
Admission: EM | Disposition: A | Discharge status: Home / Self Care | Payer: Self-pay | Source: Home / Self Care | Attending: Emergency Medicine

## 2013-01-23 ENCOUNTER — Encounter (HOSPITAL_COMMUNITY): Payer: Self-pay

## 2013-01-23 DIAGNOSIS — E785 Hyperlipidemia, unspecified: Secondary | ICD-10-CM | POA: Diagnosis present

## 2013-01-23 DIAGNOSIS — R079 Chest pain, unspecified: Secondary | ICD-10-CM

## 2013-01-23 DIAGNOSIS — J449 Chronic obstructive pulmonary disease, unspecified: Secondary | ICD-10-CM

## 2013-01-23 DIAGNOSIS — J9610 Chronic respiratory failure, unspecified whether with hypoxia or hypercapnia: Secondary | ICD-10-CM | POA: Diagnosis present

## 2013-01-23 DIAGNOSIS — I10 Essential (primary) hypertension: Secondary | ICD-10-CM

## 2013-01-23 HISTORY — PX: LEFT HEART CATHETERIZATION WITH CORONARY ANGIOGRAM: SHX5451

## 2013-01-23 LAB — PROTIME-INR: Prothrombin Time: 11.1 seconds — ABNORMAL LOW (ref 11.6–15.2)

## 2013-01-23 LAB — BASIC METABOLIC PANEL
BUN: 17 mg/dL (ref 6–23)
CO2: 27 mEq/L (ref 19–32)
GFR calc non Af Amer: 65 mL/min — ABNORMAL LOW (ref >90)
Glucose, Bld: 102 mg/dL — ABNORMAL HIGH (ref 70–99)
Potassium: 4.7 mEq/L (ref 3.5–5.1)
Sodium: 135 mEq/L (ref 135–145)

## 2013-01-23 LAB — LIPID PANEL
Cholesterol: 274 mg/dL — ABNORMAL HIGH (ref 0–200)
HDL: 31 mg/dL — ABNORMAL LOW (ref >39)
LDL Cholesterol: 173 mg/dL — ABNORMAL HIGH (ref 0–99)
Total CHOL/HDL Ratio: 8.8 RATIO
Triglycerides: 352 mg/dL — ABNORMAL HIGH (ref <150)
VLDL: 70 mg/dL — ABNORMAL HIGH (ref 0–40)

## 2013-01-23 LAB — T3, FREE: T3, Free: 2.5 pg/mL (ref 2.3–4.2)

## 2013-01-23 SURGERY — LEFT HEART CATHETERIZATION WITH CORONARY ANGIOGRAM
Anesthesia: LOCAL

## 2013-01-23 MED ORDER — SODIUM CHLORIDE 0.9 % IV SOLN: INTRAVENOUS | Status: AC

## 2013-01-23 MED ORDER — MIDAZOLAM HCL 2 MG/2ML IJ SOLN
INTRAMUSCULAR | Status: AC
  Filled 2013-01-23: qty 2

## 2013-01-23 MED ORDER — ASPIRIN EC 81 MG PO TBEC: 81.0000 mg | DELAYED_RELEASE_TABLET | Freq: Every day | ORAL | Status: AC

## 2013-01-23 MED ORDER — LIDOCAINE HCL (PF) 1 % IJ SOLN
INTRAMUSCULAR | Status: AC
  Filled 2013-01-23: qty 30

## 2013-01-23 MED ORDER — NITROGLYCERIN 0.2 MG/ML ON CALL CATH LAB
INTRAVENOUS | Status: AC
  Filled 2013-01-23: qty 1

## 2013-01-23 MED ORDER — VERAPAMIL HCL 2.5 MG/ML IV SOLN
INTRAVENOUS | Status: AC
  Filled 2013-01-23: qty 2

## 2013-01-23 MED ORDER — HEPARIN SODIUM (PORCINE) 1000 UNIT/ML IJ SOLN
INTRAMUSCULAR | Status: AC
  Filled 2013-01-23: qty 1

## 2013-01-23 MED ORDER — FENTANYL CITRATE 0.05 MG/ML IJ SOLN
INTRAMUSCULAR | Status: AC
  Filled 2013-01-23: qty 2

## 2013-01-23 MED ORDER — HEPARIN (PORCINE) IN NACL 2-0.9 UNIT/ML-% IJ SOLN
INTRAMUSCULAR | Status: AC
  Filled 2013-01-23: qty 1000

## 2013-01-23 NOTE — H&P (View-Only) (Signed)
  Subjective:   Chest pain has resolved. The patient is not in any distress. NPO, waiting for a cath.  Objective:   Vital Signs in the last 24 hours:  Temp:  [98.2 F (36.8 C)-98.5 F (36.9 C)] 98.2 F (36.8 C) (11/17 0517) Pulse Rate:  [67-89] 89 (11/17 0517) Resp:  [16-18] 18 (11/17 0517) BP: (111-123)/(61-78) 121/78 mmHg (11/17 0517) SpO2:  [97 %-99 %] 97 % (11/17 0517)  Intake/Output from previous day:  11/16 0701 - 11/17 0700 In: 960 [P.O.:960] Out: 300 [Urine:300]    . aspirin  325 mg Oral Daily  . clonazePAM  2 mg Oral BID  . diazepam  5 mg Oral On Call  . DULoxetine  90 mg Oral Daily  . heparin  5,000 Units Subcutaneous Q8H  . hydrochlorothiazide  12.5 mg Oral Daily  . levothyroxine  250 mcg Oral QAC breakfast  . lisinopril  20 mg Oral Daily  . mometasone-formoterol  2 puff Inhalation BID  . multivitamin with minerals  1 tablet Oral Daily  . nitroGLYCERIN  0.5 inch Topical Q6H  . oxyCODONE  15 mg Oral Q12H  . pantoprazole  80 mg Oral Q1200  . sodium chloride  3 mL Intravenous Q12H  . tiotropium  18 mcg Inhalation Daily  . topiramate  100 mg Oral BID   . sodium chloride 50 mL/hr at 01/23/13 0503    Physical Exam: Physical Exam The patient is resting comfortably. NAD Unable to assess JVDs S1,2, no murmurs Lungs: CTA Abdomen: NT LE: no edema   Lab Results:  Recent Labs  01/21/13 2159  WBC 8.5  HGB 12.8  PLT 347    Recent Labs  01/21/13 2159  NA 135  K 4.0  CL 93*  CO2 33*  GLUCOSE 122*  BUN 14  CREATININE 1.26*    Recent Labs  01/22/13 1100 01/22/13 1736  TROPONINI <0.30 <0.30   Hepatic Function Panel  Recent Labs  01/21/13 2159  PROT 7.9  ALBUMIN 3.6  AST 29  ALT 28  ALKPHOS 135*  BILITOT 0.3   No results found for this basename: CHOL,  in the last 72 hours No results found for this basename: PROTIME,  in the last 72 hours  Imaging: Ct Angio Chest Pe W/cm &/or Wo Cm  01/22/2013   CLINICAL DATA:  Chest pain   EXAM: CT ANGIOGRAPHY CHEST WITH CONTRAST  TECHNIQUE: Multidetector CT imaging of the chest was performed using the standard protocol during bolus administration of intravenous contrast. Multiplanar CT image reconstructions including MIPs were obtained to evaluate the vascular anatomy.  CONTRAST:  100mL OMNIPAQUE IOHEXOL 350 MG/ML SOLN  COMPARISON:  none  FINDINGS: Previous filling defects within the pulmonary arteries to suggest acute pulmonary embolism. No acute findings aorta great vessels. No pericardial fluid.  No axillary or supraclavicular lymphadenopathy. The thyroid gland is enlarged. The left lobe extends beneath the manubrium.  Review of the lung parenchyma demonstrates no effusion, infiltrate, pneumothorax. Review of the upper abdomen is unremarkable. Limited view of the skeleton is unremarkable.  Review of the MIP images confirms the above findings.  IMPRESSION: No evidence of acute pulmonary embolism.  Thyroid goiter   Electronically Signed   By: Stewart  Edmunds M.D.   On: 01/22/2013 00:01   Dg Chest Port 1 View  01/21/2013   CLINICAL DATA:  Chest pain.  EXAM: PORTABLE CHEST - 1 VIEW  COMPARISON:  None.  FINDINGS: The lungs are well-aerated. Minimal right basilar airspace opacity likely reflects   atelectasis. There is no evidence of pleural effusion or pneumothorax.  The cardiomediastinal silhouette is within normal limits. No acute osseous abnormalities are seen.  IMPRESSION: Minimal right basilar airspace opacity likely reflects atelectasis; lungs otherwise clear.   Electronically Signed   By: Jeffery  Chang M.D.   On: 01/21/2013 22:51    Cardiac Studies: Telemetry shows NSR   Assessment/Plan:   1. Atypical chest pain but responsive to SL NTG. Former smoker for forty years. Strong family history of CAD of multiple siblings in their 50s. The patient is too large to fit a nuclear camera table.  2. Hypertension. 3. Morbid obesity. 4. HLD  Plan: Will plan left heart cath Monday.   LOS:  2 days    Yotam Rhine, H 01/23/2013, 7:37 AM    

## 2013-01-23 NOTE — Progress Notes (Signed)
Right wrist TR band removed without complications, 2x2 and tegaderm applied.  Radial site care instructions given to patient.  +Reverse Allen's Test for good blood flow.  Colman Cater

## 2013-01-23 NOTE — CV Procedure (Signed)
      Cardiac Catheterization Operative Report  Mary Ryan [id number] 11/17/20149:47 AM Pcp Not In System  Procedure Performed:  1. Left Heart Catheterization 2. Selective Coronary Angiography 3. Left ventricular angiogram  Operator: Verne Carrow, MD  Arterial access site:  Right radial artery.   Indication: 57 yo female with HTN, HLD admitted with chest pain. Cardiac markers negative. Due to patient's size, stress testing was not felt to be helpful. Cardiac cath to exclude CAD.                                        Procedure Details: The risks, benefits, complications, treatment options, and expected outcomes were discussed with the patient. The patient and/or family concurred with the proposed plan, giving informed consent. The patient was brought to the cath lab after IV hydration was begun and oral premedication was given. The patient was further sedated with Versed and Fentanyl.  The right wrist was assessed with an Allens test which was positive. The right wrist was prepped and draped in a sterile fashion. 1% lidocaine was used for local anesthesia. Using the modified Seldinger access technique, a 5 French sheath was placed in the right radial artery. 3 mg Verapamil was given through the sheath. 3500 Units IV heparin given. Standard diagnostic catheters were used to perform selective coronary angiography. A pigtail catheter was used to perform a left ventricular angiogram. The sheath was removed from the right radial artery and a Terumo hemostasis band was applied at the arteriotomy site on the right wrist.   There were no immediate complications. The patient was taken to the recovery area in stable condition.   Hemodynamic Findings: Central aortic pressure: 103/70 Left ventricular pressure: 106/11/21  Angiographic Findings:  Left main: No obstructive disease.   Left Anterior Descending Artery: Large caliber vessel that courses to the apex. 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 Moderate caliber diagonal branch. No obstructive disease.   Circumflex Artery: Large caliber vessel with three moderate caliber obtuse marginal branches and left sided PDA. No obstructive disease.   Right Coronary Artery: Moderate caliber non-dominant vessel. No obstructive disease.   Left Ventricular Angiogram: LVEF=65%.   Impression: 1. No angiographic evidence of CAD 2. Normal LV function 3. Non-cardiac chest pain  Recommendations: No further ischemic workup.        Complications:  None. The patient tolerated the procedure well.

## 2013-01-23 NOTE — Interval H&P Note (Signed)
History and Physical Interval Note:  01/23/2013 9:15 AM  Mary Ryan  has presented today for cardiac cath with the diagnosis of chest pain.   The various methods of treatment have been discussed with the patient and family. After consideration of risks, benefits and other options for treatment, the patient has consented to  Procedure(s): LEFT HEART CATHETERIZATION WITH CORONARY ANGIOGRAM (N/A) as a surgical intervention .  The patient's history has been reviewed, patient examined, no change in status, stable for surgery.  I have reviewed the patient's chart and labs.  Questions were answered to the patient's satisfaction.    Cath Lab Visit (complete for each Cath Lab visit)  Clinical Evaluation Leading to the Procedure:   ACS: no  Non-ACS:    Anginal Classification: CCS III  Anti-ischemic medical therapy: No Therapy  Non-Invasive Test Results: No non-invasive testing performed  Prior CABG: No previous CABG         Mary Ryan

## 2013-01-23 NOTE — Progress Notes (Signed)
Subjective:   Chest pain has resolved. The patient is not in any distress. NPO, waiting for a cath.  Objective:   Vital Signs in the last 24 hours:  Temp:  [98.2 F (36.8 C)-98.5 F (36.9 C)] 98.2 F (36.8 C) (11/17 0517) Pulse Rate:  [67-89] 89 (11/17 0517) Resp:  [16-18] 18 (11/17 0517) BP: (111-123)/(61-78) 121/78 mmHg (11/17 0517) SpO2:  [97 %-99 %] 97 % (11/17 0517)  Intake/Output from previous day:  11/16 0701 - 11/17 0700 In: 960 [P.O.:960] Out: 300 [Urine:300]    . aspirin  325 mg Oral Daily  . clonazePAM  2 mg Oral BID  . diazepam  5 mg Oral On Call  . DULoxetine  90 mg Oral Daily  . heparin  5,000 Units Subcutaneous Q8H  . hydrochlorothiazide  12.5 mg Oral Daily  . levothyroxine  250 mcg Oral QAC breakfast  . lisinopril  20 mg Oral Daily  . mometasone-formoterol  2 puff Inhalation BID  . multivitamin with minerals  1 tablet Oral Daily  . nitroGLYCERIN  0.5 inch Topical Q6H  . oxyCODONE  15 mg Oral Q12H  . pantoprazole  80 mg Oral Q1200  . sodium chloride  3 mL Intravenous Q12H  . tiotropium  18 mcg Inhalation Daily  . topiramate  100 mg Oral BID   . sodium chloride 50 mL/hr at 01/23/13 0503    Physical Exam: Physical Exam The patient is resting comfortably. NAD Unable to assess JVDs S1,2, no murmurs Lungs: CTA Abdomen: NT LE: no edema   Lab Results:  Recent Labs  01/21/13 2159  WBC 8.5  HGB 12.8  PLT 347    Recent Labs  01/21/13 2159  NA 135  K 4.0  CL 93*  CO2 33*  GLUCOSE 122*  BUN 14  CREATININE 1.26*    Recent Labs  01/22/13 1100 01/22/13 1736  TROPONINI <0.30 <0.30   Hepatic Function Panel  Recent Labs  01/21/13 2159  PROT 7.9  ALBUMIN 3.6  AST 29  ALT 28  ALKPHOS 135*  BILITOT 0.3   No results found for this basename: CHOL,  in the last 72 hours No results found for this basename: PROTIME,  in the last 72 hours  Imaging: Ct Angio Chest Pe W/cm &/or Wo Cm  01/22/2013   CLINICAL DATA:  Chest pain   EXAM: CT ANGIOGRAPHY CHEST WITH CONTRAST  TECHNIQUE: Multidetector CT imaging of the chest was performed using the standard protocol during bolus administration of intravenous contrast. Multiplanar CT image reconstructions including MIPs were obtained to evaluate the vascular anatomy.  CONTRAST:  OMNIPAQUE IOHEXOL 350 MG/ML SOLN  COMPARISON:  none  FINDINGS: Previous filling defects within the pulmonary arteries to suggest acute pulmonary embolism. No acute findings aorta great vessels. No pericardial fluid.  No axillary or supraclavicular lymphadenopathy. The thyroid gland is enlarged. The left lobe extends beneath the manubrium.  Review of the lung parenchyma demonstrates no effusion, infiltrate, pneumothorax. Review of the upper abdomen is unremarkable. Limited view of the skeleton is unremarkable.  Review of the MIP images confirms the above findings.  IMPRESSION: No evidence of acute pulmonary embolism.  Thyroid goiter   Electronically Signed   By: Genevive Bi M.D.   On: 01/22/2013 00:01   Dg Chest Port 1 View  01/21/2013   CLINICAL DATA:  Chest pain.  EXAM: PORTABLE CHEST - 1 VIEW  COMPARISON:  None.  FINDINGS: The lungs are well-aerated. Minimal right basilar airspace opacity likely reflects  atelectasis. There is no evidence of pleural effusion or pneumothorax.  The cardiomediastinal silhouette is within normal limits. No acute osseous abnormalities are seen.  IMPRESSION: Minimal right basilar airspace opacity likely reflects atelectasis; lungs otherwise clear.   Electronically Signed   By: Roanna Raider M.D.   On: 01/21/2013 22:51    Cardiac Studies: Telemetry shows NSR   Assessment/Plan:   1. Atypical chest pain but responsive to SL NTG. Former smoker for forty years. Strong family history of CAD of multiple siblings in their 65s. The patient is too large to fit a nuclear camera table.  2. Hypertension. 3. Morbid obesity. 4. HLD  Plan: Will plan left heart cath Monday.   LOS:  2 days    Mary Ryan, H 01/23/2013, 7:37 AM

## 2013-01-23 NOTE — Progress Notes (Signed)
Reviewed discharge instructions with patient and family, they stated their understanding. Patient and nieces state she is fine to travel home without oxygen.  Discharged via wheelchair home with nieces.  Patient has home medications with her. Mary Ryan

## 2013-01-23 NOTE — Progress Notes (Signed)
[facility] PROGRESS NOTE    Mary Ryan NWG:[id number] DOB: 1955-03-10 DOA: 01/21/2013 PCP: Pcp Not In System  HPI/Brief narrative 57 year old female with history of morbid obesity, COPD, chronic respiratory failure on continuous home O2 4 L per minute, hypertension, hyperlipidemia, prior MI, strong family history of CAD, cardiac cath couple of years ago said to be negative and recent stress test 1-2 months ago said to be negative, recently traveled from [place], admitted with chest pain. Patient states that she normally has chest pain at least once a week that is relieved by NTG but last night she had chest pain at rest in the precordial region which did not resolve despite NTG x4. Patient admitted for further evaluation. Unable to do a stress test secondary to large size. Cardiology has consulted and planned cardiac cath 11/17.  Assessment/Plan:  Chest pain/Unstable angina - Multiple risk factors for CAD: Morbid obesity, hyperlipidemia, hypertension and strong family history of CAD - Cardiac enzymes negative and EKG without acute changes (low-voltage, sinus rhythm, LAFB). CTA negative for acute PE - Cardiology consultation appreciated- awaiting cardiac cath 11/17 @ 930AM - Continue aspirin and added NTP  COPD/O2 dependent chronic respiratory failure - Stable. - Continue oxygen.  Hypertension - Controlled  Hyperlipidemia   Morbid obesity  Elevated TSH - Check Free T4 & Free T3 - Goit[place] on CT   DVT prophylaxis: Heparin Code Status: Full Family Communication: None Disposition Plan: Home when medically stable   Consultants:  Cardiology  Procedures:  None  Antibiotics:  None   Subjective: Mild CP yesterday, but none since. Chronic leg pains.  Objective: Filed Vitals:   01/22/13 1500 01/22/13 2101 01/22/13 2104 01/23/13 0517  BP: 123/77  111/61 121/78  Pulse: 67  77 89  Temp: 98.4 F (36.9 C)  98.5 F (36.9 C) 98.2 F (36.8 C)  TempSrc: Oral  Oral Oral   Resp: 16  18 18   Height:      Weight:      SpO2: 98% 98% 98% 97%    Intake/Output Summary (Last 24 hours) at 01/23/13 0800 Last data filed at 01/22/13 1919  Gross per 24 hour  Intake    960 ml  Output    300 ml  Net    660 ml   Filed Weights   01/21/13 2015 01/22/13 0211  Weight: 151.501 kg (334 lb) 148.871 kg (328 lb 3.2 oz)     Exam:  General exam: Morbidly obese female sitting up on bed without distress. Respiratory system: Clear. No increased work of breathing. Cardiovascular system: S1 & S2 heard, RRR. No JVD, murmurs, gallops, clicks or pedal edema. Telemetry: Sinus rhythm. Gastrointestinal system: Abdomen is nondistended, soft and nontender. Normal bowel sounds heard. Central nervous system: Alert and oriented. No focal neurological deficits. Extremities: Symmetric 5 x 5 power.   Data Reviewed: Basic Metabolic Panel:  Recent Labs Lab 01/21/13 2159  NA 135  K 4.0  CL 93*  CO2 33*  GLUCOSE 122*  BUN 14  CREATININE 1.26*  CALCIUM 9.8   Liver Function Tests:  Recent Labs Lab 01/21/13 2159  AST 29  ALT 28  ALKPHOS 135*  BILITOT 0.3  PROT 7.9  ALBUMIN 3.6   No results found for this basename: LIPASE, AMYLASE,  in the last 168 hours No results found for this basename: AMMONIA,  in the last 168 hours CBC:  Recent Labs Lab 01/21/13 2159  WBC 8.5  NEUTROABS 5.3  HGB 12.8  HCT 39.9  MCV 91.9  PLT  347   Cardiac Enzymes:  Recent Labs Lab 01/21/13 2200 01/22/13 0712 01/22/13 1100 01/22/13 1736  TROPONINI <0.30 <0.30 <0.30 <0.30   BNP (last 3 results)  Recent Labs  01/21/13 2200  PROBNP 81.4   CBG: No results found for this basename: GLUCAP,  in the last 168 hours  No results found for this or any previous visit (from the past 240 hour(s)).    Additional labs: 1. TSH: 7.897 2. HbA1C: 6.2     Studies: Ct Angio Chest Pe W/cm &/or Wo Cm  01/22/2013   CLINICAL DATA:  Chest pain  EXAM: CT ANGIOGRAPHY CHEST WITH CONTRAST   TECHNIQUE: Multidetector CT imaging of the chest was performed using the standard protocol during bolus administration of intravenous contrast. Multiplanar CT image reconstructions including MIPs were obtained to evaluate the vascular anatomy.  CONTRAST:  OMNIPAQUE IOHEXOL 350 MG/ML SOLN  COMPARISON:  none  FINDINGS: Previous filling defects within the pulmonary arteries to suggest acute pulmonary embolism. No acute findings aorta great vessels. No pericardial fluid.  No axillary or supraclavicular lymphadenopathy. The thyroid gland is enlarged. The left lobe extends beneath the manubrium.  Review of the lung parenchyma demonstrates no effusion, infiltrate, pneumothorax. Review of the upper abdomen is unremarkable. Limited view of the skeleton is unremarkable.  Review of the MIP images confirms the above findings.  IMPRESSION: No evidence of acute pulmonary embolism.  Thyroid goiter   Electronically Signed   By: Genevive Bi M.D.   On: 01/22/2013 00:01   Dg Chest Port 1 View  01/21/2013   CLINICAL DATA:  Chest pain.  EXAM: PORTABLE CHEST - 1 VIEW  COMPARISON:  None.  FINDINGS: The lungs are well-aerated. Minimal right basilar airspace opacity likely reflects atelectasis. There is no evidence of pleural effusion or pneumothorax.  The cardiomediastinal silhouette is within normal limits. No acute osseous abnormalities are seen.  IMPRESSION: Minimal right basilar airspace opacity likely reflects atelectasis; lungs otherwise clear.   Electronically Signed   By: Roanna Raider M.D.   On: 01/21/2013 22:51        Scheduled Meds: . aspirin  325 mg Oral Daily  . clonazePAM  2 mg Oral BID  . diazepam  5 mg Oral On Call  . DULoxetine  90 mg Oral Daily  . heparin  5,000 Units Subcutaneous Q8H  . hydrochlorothiazide  12.5 mg Oral Daily  . levothyroxine  250 mcg Oral QAC breakfast  . lisinopril  20 mg Oral Daily  . mometasone-formoterol  2 puff Inhalation BID  . multivitamin with minerals  1 tablet  Oral Daily  . nitroGLYCERIN  0.5 inch Topical Q6H  . oxyCODONE  15 mg Oral Q12H  . pantoprazole  80 mg Oral Q1200  . sodium chloride  3 mL Intravenous Q12H  . tiotropium  18 mcg Inhalation Daily  . topiramate  100 mg Oral BID   Continuous Infusions: . sodium chloride 50 mL/hr at 01/23/13 0503    Principal Problem:   Chest pain Active Problems:   HTN (hypertension)    Time spent: 25 minutes    Gentry Pilson, MD, FACP, FHM. [facility] Pager [phone]  If 7PM-7AM, please contact night-coverage [email or web address] Password [email or web address] 01/23/2013, 8:00 AM    LOS: 2 days

## 2013-01-23 NOTE — Discharge Summary (Signed)
Physician Discharge Summary  Mary Ryan WGN:[id number] DOB: January 21, 1956 DOA: 01/21/2013  PCP: Pcp Not In System PCP in [place]  Admit date: 01/21/2013 Discharge date: 01/23/2013  Time spent: Less than 30 minutes  Recommendations for Outpatient Follow-up:  1. PCP in 1 week  Discharge Diagnoses:  Principal Problem:   Chest pain Active Problems:   HTN (hypertension)   COPD (chronic obstructive pulmonary disease)   Chronic respiratory failure   Hyperlipidemia   Morbid obesity   Discharge Condition: Improved & Stable  Diet recommendation: Heart Healthy diet.  Filed Weights   01/21/13 2015 01/22/13 0211  Weight: 151.501 kg (334 lb) 148.871 kg (328 lb 3.2 oz)    History of present illness & Hospital course:  57 year old female with history of morbid obesity, COPD, chronic respiratory failure on continuous home O2 4 L per minute, hypertension, hyperlipidemia, prior MI, strong family history of CAD, cardiac cath couple of years ago said to be negative and recent stress test 1-2 months ago said to be negative, recently traveled from [place], admitted with chest pain. Patient states that she normally has chest pain at least once a week that is relieved by NTG but last night she had chest pain at rest in the precordial region which did not resolve despite NTG x4. Patient admitted for further evaluation. CE's negative. EKG without acute changes. CTA chest negative for PE. She was placed on ASA & NTP. Unable to do a stress test secondary to large size. She has multiple CAD risk factors: Morbid obesity, HL, HTN & strong FH of CAD. Cardiology consulted and performed cardiac cath on 11/17 - results as below. CP resolved. Cardiology cleared patient for DC home. Patient advised to follow up with PCP regarding abnormal TSH & Hyperlipidemia. FT4 & FT3 WNL.   Consultations:  Cardiology  Procedures:  Procedure Performed:  1. Left Heart Catheterization 2. Selective Coronary Angiography 3. Left  ventricular angiogram  Impression:  1. No angiographic evidence of CAD  2. Normal LV function  3. Non-cardiac chest pain   Recommendations: No further ischemic workup.    Discharge Exam:  Complaints: No further chest pains.  Filed Vitals:   01/23/13 1045 01/23/13 1100 01/23/13 1130 01/23/13 1200  BP: 120/71 117/70 114/72 102/74  Pulse: 96 87 87 92  Temp:      TempSrc:      Resp:      Height:      Weight:      SpO2: 96% 97% 94% 96%    General exam: Morbidly obese female sitting up on bed without distress.  Respiratory system: Clear. No increased work of breathing.  Cardiovascular system: S1 & S2 heard, RRR. No JVD, murmurs, gallops, clicks or pedal edema. Telemetry: Sinus rhythm.  Gastrointestinal system: Abdomen is nondistended, soft and nontender. Normal bowel sounds heard.  Central nervous system: Alert and oriented. No focal neurological deficits.  Extremities: Symmetric 5 x 5 power.   Discharge Instructions      Discharge Orders   Future Orders Complete By Expires   Call MD for:  difficulty breathing, headache or visual disturbances  As directed    Call MD for:  severe uncontrolled pain  As directed    Diet - low sodium heart healthy  As directed    Discharge instructions  As directed    Comments:     Continue Oxygen via nasal cannula at prior dose (4L/min)   Increase activity slowly  As directed        Medication List  albuterol 108 (90 BASE) MCG/ACT inhaler  Commonly known as:  PROVENTIL HFA;VENTOLIN HFA  Inhale 1-2 puffs into the lungs every 6 (six) hours as needed for wheezing or shortness of breath.     aspirin EC 81 MG tablet  Take 1 tablet (81 mg total) by mouth daily.     clonazePAM 2 MG tablet  Commonly known as:  KLONOPIN  Take 2 mg by mouth 2 (two) times daily.     diphenhydrAMINE 25 MG tablet  Commonly known as:  BENADRYL  Take 25 mg by mouth at bedtime as needed for sleep.     DULoxetine 60 MG capsule  Commonly known as:   CYMBALTA  Take 60 mg by mouth daily.     DULoxetine 30 MG capsule  Commonly known as:  CYMBALTA  Take 30 mg by mouth every morning.     esomeprazole 40 MG capsule  Commonly known as:  NEXIUM  Take 40 mg by mouth daily at 12 noon.     Fluticasone-Salmeterol 250-50 MCG/DOSE Aepb  Commonly known as:  ADVAIR  Inhale 1 puff into the lungs 2 (two) times daily.     levothyroxine 125 MCG tablet  Commonly known as:  SYNTHROID, LEVOTHROID  Take 250 mcg by mouth daily before breakfast. Takes 2 tablet every morning     lisinopril-hydrochlorothiazide 20-12.5 MG per tablet  Commonly known as:  PRINZIDE,ZESTORETIC  Take 1 tablet by mouth daily.     multivitamin with minerals Tabs tablet  Take 1 tablet by mouth daily.     oxyCODONE 15 MG immediate release tablet  Commonly known as:  ROXICODONE  Take 15 mg by mouth every 12 (twelve) hours.     tiotropium 18 MCG inhalation capsule  Commonly known as:  SPIRIVA  Place 18 mcg into inhaler and inhale daily.     topiramate 100 MG tablet  Commonly known as:  TOPAMAX  Take 100 mg by mouth 2 (two) times daily.       Follow-up Information   Follow up with Primary Medical Doctor. Schedule an appointment as soon as possible for a visit in 1 week. (Please discuss with MD regarding adjusting your thyroid medication and starting new medication for cholesterol.)        The results of significant diagnostics from this hospitalization (including imaging, microbiology, ancillary and laboratory) are listed below for reference.    Significant Diagnostic Studies: Ct Angio Chest Pe W/cm &/or Wo Cm  01/22/2013   CLINICAL DATA:  Chest pain  EXAM: CT ANGIOGRAPHY CHEST WITH CONTRAST  TECHNIQUE: Multidetector CT imaging of the chest was performed using the standard protocol during bolus administration of intravenous contrast. Multiplanar CT image reconstructions including MIPs were obtained to evaluate the vascular anatomy.  CONTRAST:  OMNIPAQUE IOHEXOL  350 MG/ML SOLN  COMPARISON:  none  FINDINGS: Previous filling defects within the pulmonary arteries to suggest acute pulmonary embolism. No acute findings aorta great vessels. No pericardial fluid.  No axillary or supraclavicular lymphadenopathy. The thyroid gland is enlarged. The left lobe extends beneath the manubrium.  Review of the lung parenchyma demonstrates no effusion, infiltrate, pneumothorax. Review of the upper abdomen is unremarkable. Limited view of the skeleton is unremarkable.  Review of the MIP images confirms the above findings.  IMPRESSION: No evidence of acute pulmonary embolism.  Thyroid goiter   Electronically Signed   By: Genevive Bi M.D.   On: 01/22/2013 00:01   Dg Chest Port 1 View  01/21/2013   CLINICAL DATA:  Chest pain.  EXAM: PORTABLE CHEST - 1 VIEW  COMPARISON:  None.  FINDINGS: The lungs are well-aerated. Minimal right basilar airspace opacity likely reflects atelectasis. There is no evidence of pleural effusion or pneumothorax.  The cardiomediastinal silhouette is within normal limits. No acute osseous abnormalities are seen.  IMPRESSION: Minimal right basilar airspace opacity likely reflects atelectasis; lungs otherwise clear.   Electronically Signed   By: Roanna Raider M.D.   On: 01/21/2013 22:51    Microbiology: No results found for this or any previous visit (from the past 240 hour(s)).   Labs: Basic Metabolic Panel:  Recent Labs Lab 01/21/13 2159 01/23/13 0715  NA 135 135  K 4.0 4.7  CL 93* 95*  CO2 33* 27  GLUCOSE 122* 102*  BUN 14 17  CREATININE 1.26* 0.96  CALCIUM 9.8 9.9   Liver Function Tests:  Recent Labs Lab 01/21/13 2159  AST 29  ALT 28  ALKPHOS 135*  BILITOT 0.3  PROT 7.9  ALBUMIN 3.6   No results found for this basename: LIPASE, AMYLASE,  in the last 168 hours No results found for this basename: AMMONIA,  in the last 168 hours CBC:  Recent Labs Lab 01/21/13 2159  WBC 8.5  NEUTROABS 5.3  HGB 12.8  HCT 39.9  MCV 91.9   PLT 347   Cardiac Enzymes:  Recent Labs Lab 01/21/13 2200 01/22/13 0712 01/22/13 1100 01/22/13 1736  TROPONINI <0.30 <0.30 <0.30 <0.30   BNP: BNP (last 3 results)  Recent Labs  01/21/13 2200  PROBNP 81.4   CBG: No results found for this basename: GLUCAP,  in the last 168 hours  Additional labs: 1. Lipids: Cholesterol: 274, TG 353, HDL 31, LDL 173, VLDL 70 2. TSH 7.897, FT4: 0.86, FT3: 2.5 3. HbA1C: 6.2   Signed:  Marcellus Scott, MD, FACP, FHM. [facility] Pager [phone]  If 7PM-7AM, please contact night-coverage [email or web address] Password [email or web address] 01/23/2013, 1:52 PM

## 2014-02-15 ENCOUNTER — Encounter (HOSPITAL_COMMUNITY): Payer: Self-pay | Admitting: Cardiovascular Disease

## 2015-03-18 IMAGING — CT CT ANGIO CHEST
2 of 9 series · 19 of 46 positions shown · IV contrast (APPLIED)
Comparison: none

CLINICAL DATA: Chest pain

EXAM:
CT ANGIOGRAPHY CHEST WITH CONTRAST
TECHNIQUE: Multidetector CT imaging of the chest was performed using the
standard protocol during bolus administration of intravenous
contrast. Multiplanar CT image reconstructions including MIPs were
obtained to evaluate the vascular anatomy.
CONTRAST:  100mL OMNIPAQUE IOHEXOL 350 MG/ML SOLN

[Series 6: coronal mpr · coronal · 0.59mm/px · 3 of 151 slices shown]
[im 38/151  soft-tissue]
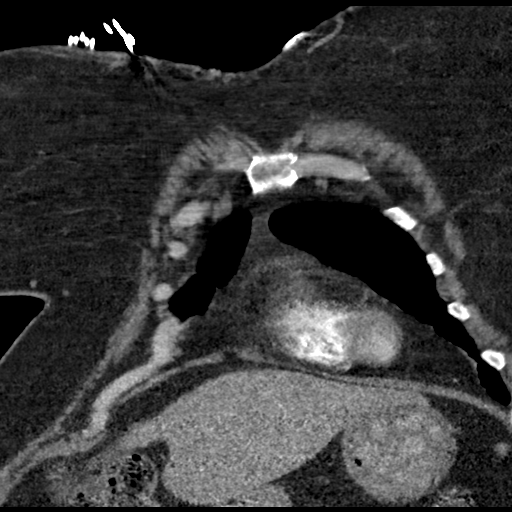
[im 76/151  soft-tissue]
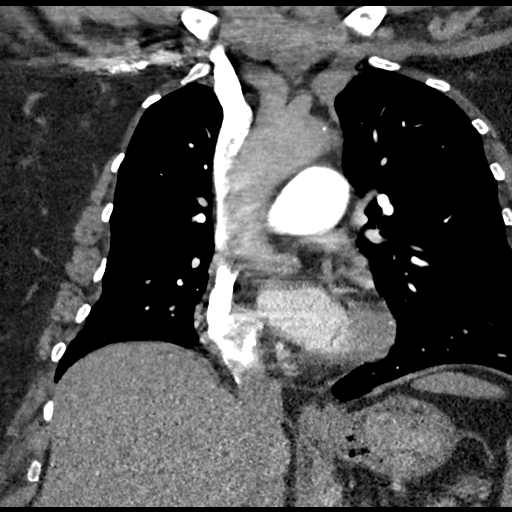
[im 113/151  soft-tissue]
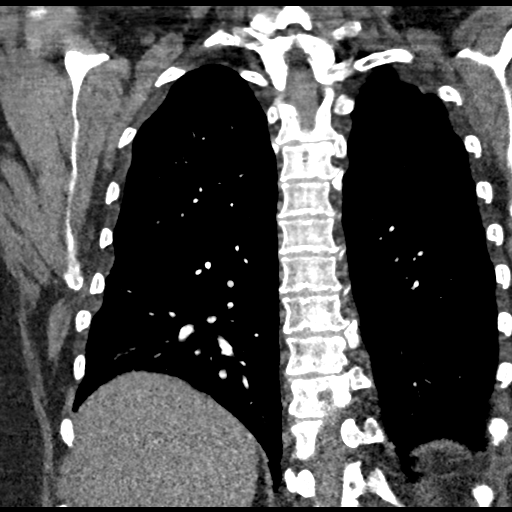

[Series 7: thins · axial · 0.72mm/px · z∈[+1211,+1471]mm · 16 of 294 slices shown]
[im 17/294  lung]
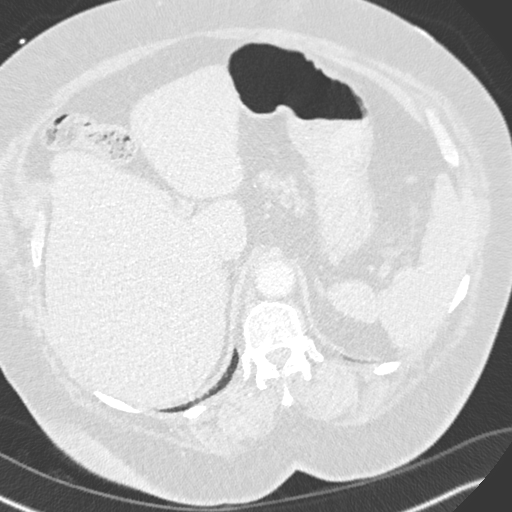
[im 33/294  soft-tissue]
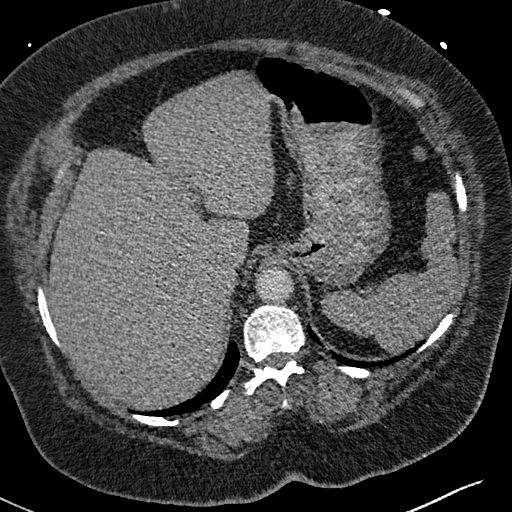
[im 49/294  lung]
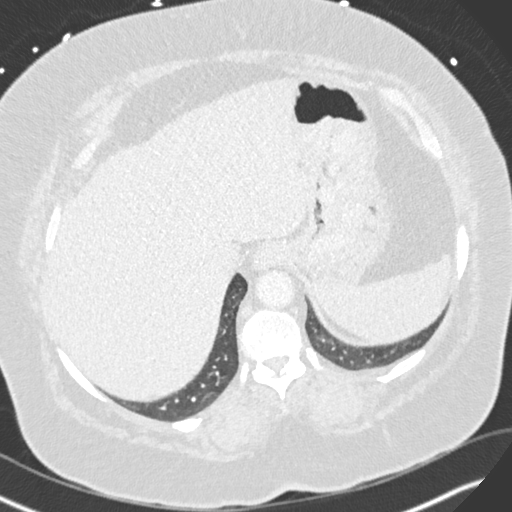
[im 66/294  soft-tissue]
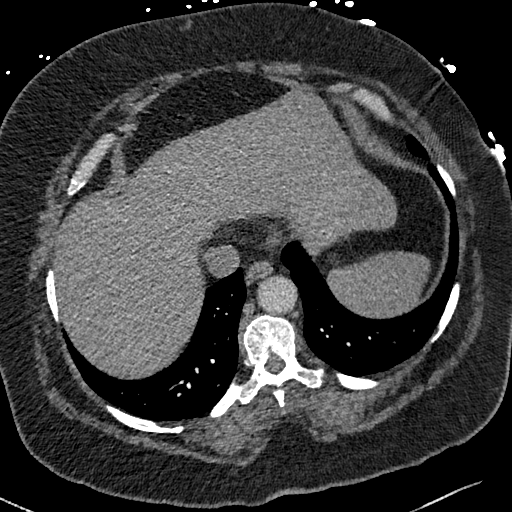
[im 82/294  lung]
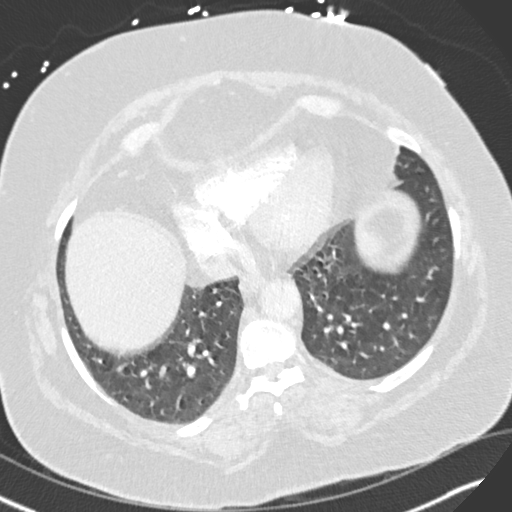
[im 98/294  soft-tissue]
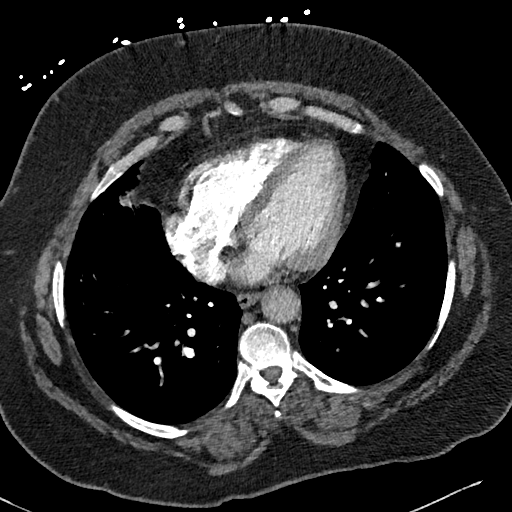
[im 114/294  lung]
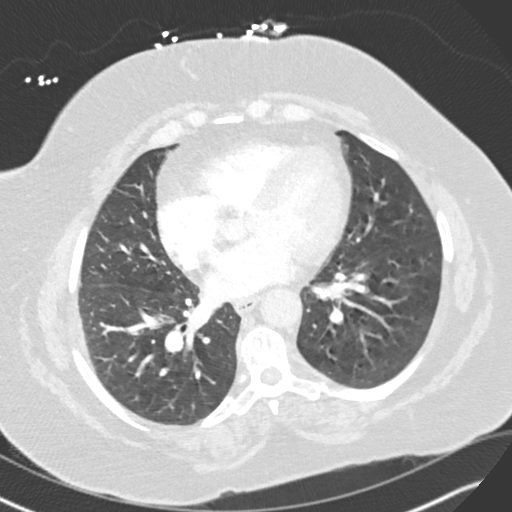
[im 131/294  soft-tissue]
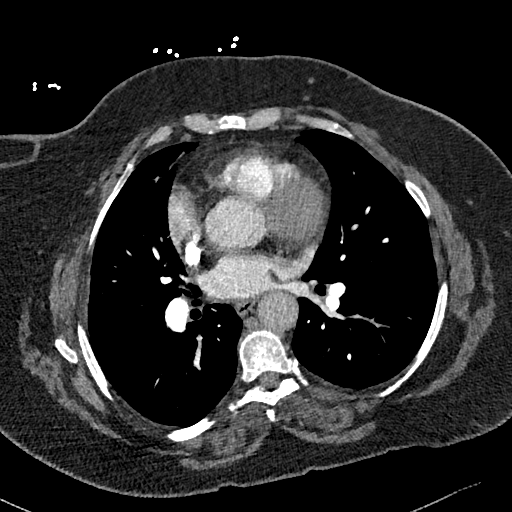
[im 163/294  lung]
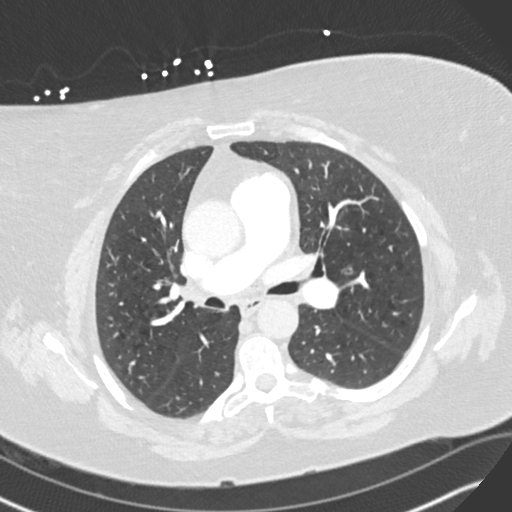
[im 180/294  soft-tissue]
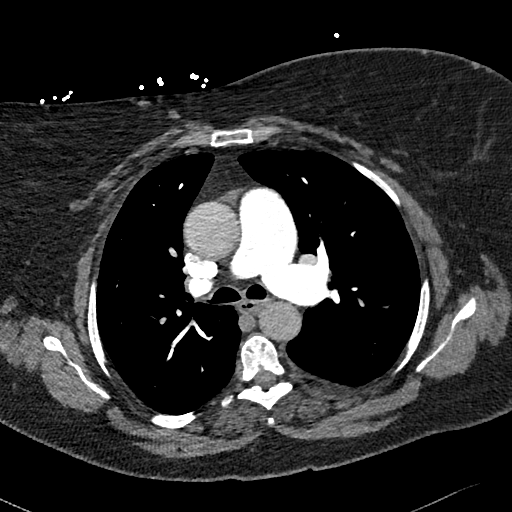
[im 196/294  lung]
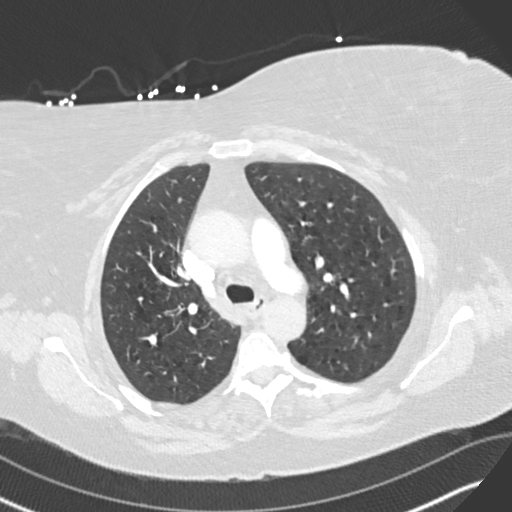
[im 212/294  soft-tissue]
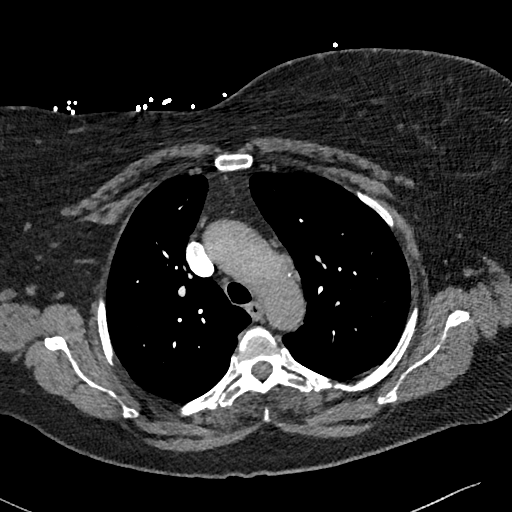
[im 228/294  lung]
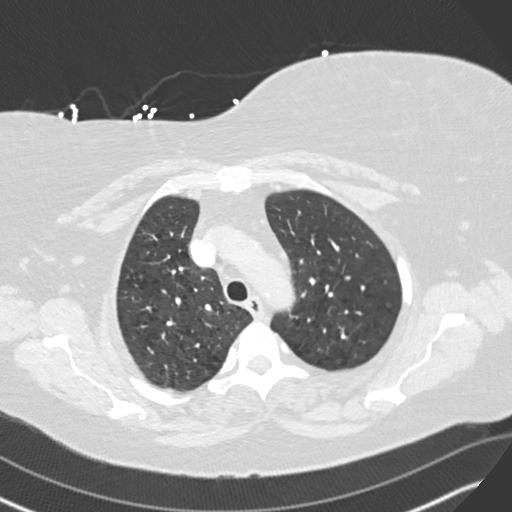
[im 245/294  soft-tissue]
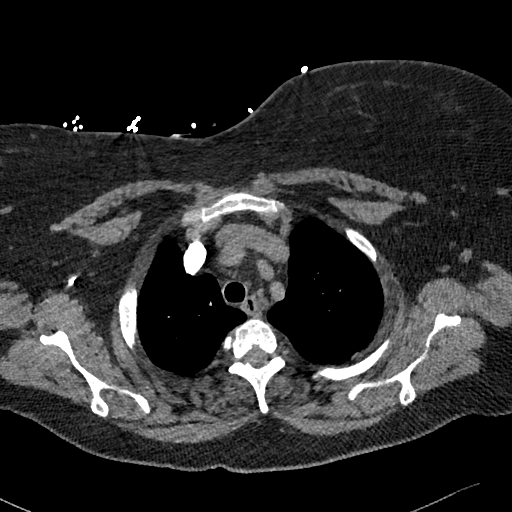
[im 261/294  lung]
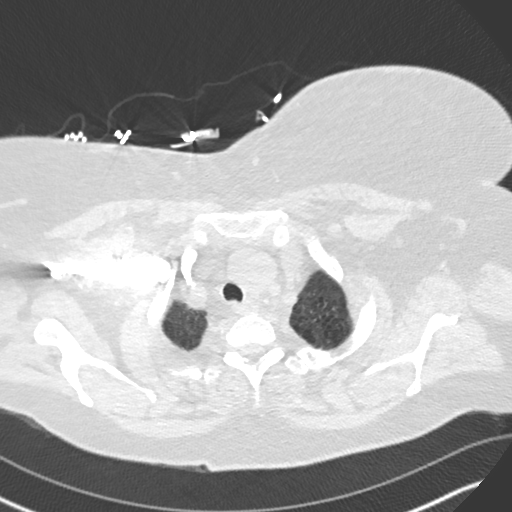
[im 277/294  soft-tissue]
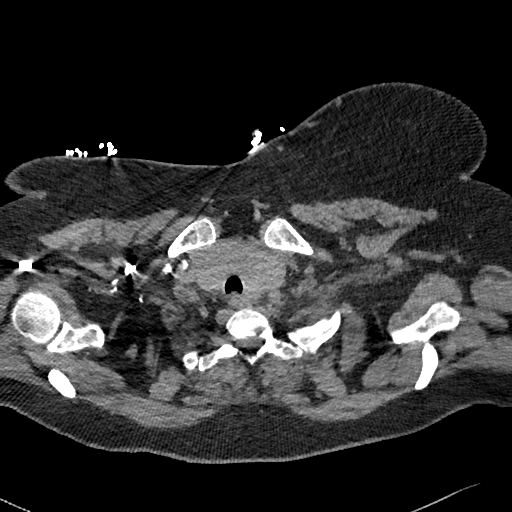

[19 of 46 positions shown; findings below may reference images not displayed]

FINDINGS: Previous filling defects within the pulmonary arteries to suggest
acute pulmonary embolism. No acute findings aorta great vessels. No
pericardial fluid.

No axillary or supraclavicular lymphadenopathy. The thyroid gland is
enlarged. The left lobe extends beneath the manubrium.

Review of the lung parenchyma demonstrates no effusion, infiltrate,
pneumothorax. Review of the upper abdomen is unremarkable. Limited
view of the skeleton is unremarkable.

Review of the MIP images confirms the above findings.
IMPRESSION: No evidence of acute pulmonary embolism.

Thyroid goiter
